# Patient Record
Sex: Male | Born: 1960 | Race: White | Hispanic: Refuse to answer | Smoking: Current every day smoker
Health system: Southern US, Community
[De-identification: ages and names within clinical notes are randomized; demographics above are authoritative.]

## PROBLEM LIST (undated history)

## (undated) DIAGNOSIS — E119 Type 2 diabetes mellitus without complications: Secondary | ICD-10-CM

## (undated) DIAGNOSIS — T7840XA Allergy, unspecified, initial encounter: Secondary | ICD-10-CM

## (undated) DIAGNOSIS — I10 Essential (primary) hypertension: Secondary | ICD-10-CM

## (undated) DIAGNOSIS — R011 Cardiac murmur, unspecified: Secondary | ICD-10-CM

## (undated) DIAGNOSIS — R6882 Decreased libido: Secondary | ICD-10-CM

## (undated) DIAGNOSIS — R0902 Hypoxemia: Secondary | ICD-10-CM

## (undated) HISTORY — DX: Hypoxemia: R09.02

## (undated) HISTORY — DX: Type 2 diabetes mellitus without complications: E11.9

## (undated) HISTORY — DX: Essential (primary) hypertension: I10

## (undated) HISTORY — DX: Decreased libido: R68.82

## (undated) HISTORY — PX: WISDOM TOOTH EXTRACTION: SHX21

## (undated) HISTORY — PX: CARDIAC VALVE REPLACEMENT: SHX585

## (undated) HISTORY — DX: Morbid (severe) obesity due to excess calories: E66.01

## (undated) HISTORY — DX: Cardiac murmur, unspecified: R01.1

## (undated) HISTORY — DX: Allergy, unspecified, initial encounter: T78.40XA

---
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
## 2004-09-02 DIAGNOSIS — E785 Hyperlipidemia, unspecified: Secondary | ICD-10-CM | POA: Insufficient documentation

## 2006-12-18 ENCOUNTER — Ambulatory Visit: Payer: Self-pay | Admitting: Gastroenterology

## 2006-12-18 LAB — HM COLONOSCOPY

## 2014-04-27 ENCOUNTER — Ambulatory Visit: Payer: Self-pay | Admitting: Family Medicine

## 2014-04-27 LAB — HEPATIC FUNCTION PANEL: ALT: 27 U/L (ref 10–40)

## 2014-04-27 LAB — LIPID PANEL
Cholesterol: 156 mg/dL (ref 0–200)
HDL: 39 mg/dL (ref 35–70)
LDL CALC: 95 mg/dL
Triglycerides: 109 mg/dL (ref 40–160)

## 2014-04-27 LAB — CBC AND DIFFERENTIAL
HCT: 44 % (ref 41–53)
Hemoglobin: 14.4 g/dL (ref 13.5–17.5)
Platelets: 189 10*3/uL (ref 150–399)
WBC: 8.9 10^3/mL

## 2014-04-27 LAB — BASIC METABOLIC PANEL
BUN: 14 mg/dL (ref 4–21)
Creatinine: 0.8 mg/dL (ref <=1.3)
GLUCOSE: 137 mg/dL
Potassium: 4.1 mmol/L (ref 3.4–5.3)
SODIUM: 141 mmol/L (ref 137–147)

## 2014-04-27 LAB — HEMOGLOBIN A1C: HEMOGLOBIN A1C: 7.4 % — AB (ref 4.0–6.0)

## 2014-05-18 LAB — PSA: PSA: 4.2

## 2015-02-08 DIAGNOSIS — E349 Endocrine disorder, unspecified: Secondary | ICD-10-CM | POA: Insufficient documentation

## 2015-02-08 DIAGNOSIS — R0902 Hypoxemia: Secondary | ICD-10-CM | POA: Insufficient documentation

## 2015-02-08 DIAGNOSIS — R972 Elevated prostate specific antigen [PSA]: Secondary | ICD-10-CM | POA: Insufficient documentation

## 2015-02-08 DIAGNOSIS — R6882 Decreased libido: Secondary | ICD-10-CM | POA: Insufficient documentation

## 2015-02-08 DIAGNOSIS — Z1389 Encounter for screening for other disorder: Secondary | ICD-10-CM | POA: Insufficient documentation

## 2015-02-09 ENCOUNTER — Ambulatory Visit (INDEPENDENT_AMBULATORY_CARE_PROVIDER_SITE_OTHER): Payer: BLUE CROSS/BLUE SHIELD | Admitting: Family Medicine

## 2015-02-09 ENCOUNTER — Encounter: Payer: Self-pay | Admitting: Family Medicine

## 2015-02-09 VITALS — BP 115/80 | HR 62 | Temp 98.5°F | Resp 16 | Wt 321.0 lb

## 2015-02-09 DIAGNOSIS — E119 Type 2 diabetes mellitus without complications: Secondary | ICD-10-CM

## 2015-02-09 DIAGNOSIS — E785 Hyperlipidemia, unspecified: Secondary | ICD-10-CM

## 2015-02-09 DIAGNOSIS — I10 Essential (primary) hypertension: Secondary | ICD-10-CM

## 2015-02-09 DIAGNOSIS — E1165 Type 2 diabetes mellitus with hyperglycemia: Secondary | ICD-10-CM

## 2015-02-09 DIAGNOSIS — IMO0002 Reserved for concepts with insufficient information to code with codable children: Secondary | ICD-10-CM

## 2015-02-09 LAB — POCT GLYCOSYLATED HEMOGLOBIN (HGB A1C)
Est. average glucose Bld gHb Est-mCnc: 154
HEMOGLOBIN A1C: 7

## 2015-02-09 MED ORDER — METFORMIN HCL ER (OSM) 500 MG PO TB24: 1000.0000 mg | ORAL_TABLET | Freq: Every day | ORAL | Status: DC

## 2015-02-09 MED ORDER — METOPROLOL SUCCINATE ER 200 MG PO TB24: 200.0000 mg | ORAL_TABLET | Freq: Every day | ORAL | Status: DC

## 2015-02-09 NOTE — Progress Notes (Signed)
Patient: Daniel Alvarado Male    DOB: 11/09/1960   54 y.o.   MRN: [id number] Visit Date: 02/09/2015  Today's Provider: Lelon Huh, MD   Chief Complaint  Patient presents with  . Follow-up    Medication  . Hypertension  . Hyperlipidemia  . Diabetes   Subjective:    HPI   Hypertension, follow-up:  BP Readings from Last 3 Encounters:  02/09/15 115/80  04/27/14 148/70    He was last seen for hypertension 10 months ago.  BP at that visit was 148/70. Management changes since that visit include none. He reports fair compliance with treatment. He is not having side effects. none  He is not exercising. He is adherent to low salt diet.   Outside blood pressures are in 130s/80s. He is experiencing none.  Patient denies none.   Cardiovascular risk factors include diabetes mellitus, dyslipidemia, hypertension and male gender.  Use of agents associated with hypertension: none.    Weight trend: stable  ------------------------------------------------------------------------   Diabetes Mellitus Type II, Follow-up:   Lab Results  Component Value Date   HGBA1C 7.0 02/09/2015   HGBA1C 7.4* 04/27/2014    Last seen for diabetes 10 months ago.  Management changes included work on wt loss and diet. He reports good compliance with treatment. He is not having side effects.  Current symptoms include none Home blood sugar records: fasting range: 120s to 150s  Episodes of hypoglycemia? yes - only if he misses meals   Current Insulin Regimen: none Most Recent Eye Exam: in August, 2015 Weight trend: stable Prior visit with dietician: yes -  Current diet: diabetic Current exercise: none  Pertinent Labs: No results found for: CHOL, TRIG, CHOLHDL, CREATININE  Wt Readings from Last 3 Encounters:  02/09/15 321 lb (145.605 kg)  04/27/14 321 lb (145.605 kg)    ------------------------------------------------------------------------   Lipid/Cholesterol, Follow-up:    Last seen for this10 months ago.  Management changes since that visit include none. . Last Lipid Panel:    Component Value Date/Time   CHOL 156 04/27/2014   TRIG 109 04/27/2014   HDL 39 04/27/2014   [place] 95 04/27/2014    He reports excellent compliance with treatment. He is not having side effects.  Current symptoms include none. Weight trend: stable Currnt diet: not asked Current exercise: none  Wt Readings from Last 3 Encounters:  02/09/15 321 lb (145.605 kg)  04/27/14 321 lb (145.605 kg)    -------------------------------------------------------------------     Previous Medications   ASPIRIN 81 MG TABLET       FELODIPINE (PLENDIL) 10 MG 24 HR TABLET    Take by mouth.   LISINOPRIL-HYDROCHLOROTHIAZIDE (PRINZIDE,ZESTORETIC) 20-25 MG PER TABLET    Take by mouth.   METFORMIN (FORTAMET) 500 MG (OSM) 24 HR TABLET    Take by mouth.   METOPROLOL (TOPROL XL) 200 MG 24 HR TABLET    Take by mouth.   MULTIPLE VITAMIN PO       OMEGA-3 FATTY ACIDS PO    Take by mouth.   PIOGLITAZONE (ACTOS) 45 MG TABLET    Take by mouth.   POTASSIUM CHLORIDE SA (KLOR-CON M20) 20 MEQ TABLET    Take by mouth.   SIMVASTATIN (ZOCOR) 40 MG TABLET    Take by mouth.    Review of Systems  History  Substance Use Topics  . Smoking status: Never Smoker   . Smokeless tobacco: Current User    Types: Snuff  . Alcohol Use: Yes   Objective:  BP 115/80 mmHg  Pulse 62  Temp(Src) 98.5 F (36.9 C) (Oral)  Resp 16  Wt 321 lb (145.605 kg)  SpO2 95%  Physical Exam  General Appearance:    Alert, cooperative, no distress  Eyes:    PERRL, conjunctiva/corneas clear, EOM's intact       Lungs:     Clear to auscultation bilaterally, respirations unlabored  Heart:    Regular rate and rhythm  Neurologic:   Awake, alert, oriented x 3. No apparent focal neurological           defect.              Assessment & Plan:     1. Type 2 diabetes mellitus without complication Fairly well controlled Continue  current medications.   - POCT HgB A1C  2. Essential hypertension well controlled Continue current medications.   - metoprolol (TOPROL XL) 200 MG 24 hr tablet; Take 1 tablet (200 mg total) by mouth daily.  Dispense: 30 tablet; Refill: 5 - Renal function panel - TSH  3. HLD (hyperlipidemia)  - ALT - Lipid panel - TSH    Follow up: No Follow-up on file.

## 2015-02-10 LAB — RENAL FUNCTION PANEL
ALBUMIN: 4 g/dL (ref 3.5–5.5)
BUN/Creatinine Ratio: 23 — ABNORMAL HIGH (ref 9–20)
BUN: 21 mg/dL (ref 6–24)
CO2: 27 mmol/L (ref 18–29)
CREATININE: 0.91 mg/dL (ref 0.76–1.27)
Calcium: 10.1 mg/dL (ref 8.7–10.2)
Chloride: 100 mmol/L (ref 97–108)
GFR calc Af Amer: 112 mL/min/{1.73_m2} (ref >59)
GFR calc non Af Amer: 97 mL/min/{1.73_m2} (ref >59)
GLUCOSE: 152 mg/dL — AB (ref 65–99)
PHOSPHORUS: 3.8 mg/dL (ref 2.5–4.5)
Potassium: 4.5 mmol/L (ref 3.5–5.2)
SODIUM: 141 mmol/L (ref 134–144)

## 2015-02-10 LAB — LIPID PANEL
CHOL/HDL RATIO: 4.7 ratio (ref 0.0–5.0)
CHOLESTEROL TOTAL: 186 mg/dL (ref 100–199)
HDL: 40 mg/dL (ref >39)
LDL CALC: 118 mg/dL — AB (ref 0–99)
TRIGLYCERIDES: 139 mg/dL (ref 0–149)
VLDL Cholesterol Cal: 28 mg/dL (ref 5–40)

## 2015-02-10 LAB — ALT: ALT: 23 IU/L (ref 0–44)

## 2015-02-10 LAB — TSH: TSH: 2.35 u[IU]/mL (ref 0.450–4.500)

## 2015-04-28 ENCOUNTER — Encounter: Payer: BLUE CROSS/BLUE SHIELD | Admitting: Family Medicine

## 2015-05-16 ENCOUNTER — Encounter: Payer: Self-pay | Admitting: Family Medicine

## 2015-05-16 ENCOUNTER — Ambulatory Visit (INDEPENDENT_AMBULATORY_CARE_PROVIDER_SITE_OTHER): Payer: Managed Care, Other (non HMO) | Admitting: Family Medicine

## 2015-05-16 VITALS — BP 120/70 | HR 55 | Temp 98.4°F | Resp 16 | Ht 71.0 in | Wt 320.0 lb

## 2015-05-16 DIAGNOSIS — E785 Hyperlipidemia, unspecified: Secondary | ICD-10-CM

## 2015-05-16 DIAGNOSIS — Z0000 Encounter for general adult medical examination without abnormal findings: Secondary | ICD-10-CM

## 2015-05-16 DIAGNOSIS — I10 Essential (primary) hypertension: Secondary | ICD-10-CM

## 2015-05-16 DIAGNOSIS — E1165 Type 2 diabetes mellitus with hyperglycemia: Secondary | ICD-10-CM

## 2015-05-16 DIAGNOSIS — IMO0002 Reserved for concepts with insufficient information to code with codable children: Secondary | ICD-10-CM

## 2015-05-16 DIAGNOSIS — E668 Other obesity: Secondary | ICD-10-CM

## 2015-05-16 MED ORDER — METOPROLOL SUCCINATE ER 100 MG PO TB24: 100.0000 mg | ORAL_TABLET | Freq: Every day | ORAL | Status: DC

## 2015-05-16 NOTE — Progress Notes (Signed)
Patient: Daniel Alvarado, Male    DOB: 1960-10-17, 54 y.o.   MRN: [id number] Visit Date: 05/16/2015  Today's Provider: Lelon Huh, MD   Chief Complaint  Patient presents with  . Annual Exam  . Hypertension    follow up  . Diabetes    follow up  . Hyperlipidemia    follow up   Subjective:    Annual physical exam Daniel Alvarado is a 54 y.o. male who presents today for health maintenance and complete physical. He feels well. He reports exercising no. He reports he is sleeping fairly well.  -----------------------------------------------------------------  Diabetes Mellitus Type II, Follow-up:   Lab Results  Component Value Date   HGBA1C 7.0 02/09/2015   HGBA1C 7.4* 04/27/2014   Last seen for diabetes 3 months ago.  Management since then includes none. He reports good compliance with treatment. He is not having side effects. none Current symptoms include none and have been stable. Home blood sugar records: fasting range: n/a  Episodes of hypoglycemia? no   Current Insulin Regimen: n/a Most Recent Eye Exam: due now Weight trend: stable  Current diet: in general, an "unhealthy" diet Current exercise: none  ------------------------------------------------------------------------   Hypertension, follow-up:  BP Readings from Last 3 Encounters:  05/16/15 120/70  02/09/15 115/80  04/27/14 148/70    He was last seen for hypertension 3 months ago.  BP at that visit was 115/80. Management since that visit includes nnoe .He reports good compliance with treatment. He is not having side effects. none  He is not exercising. He is not adherent to low salt diet.   Outside blood pressures are n/a. He is experiencing none.  Patient denies none.   Cardiovascular risk factors include diabetes mellitus.  Use of agents associated with hypertension: none.   ------------------------------------------------------------------------    Lipid/Cholesterol,  Follow-up:   Last seen for this 3 months ago.  Management since that visit includes none.  Last Lipid Panel:    Component Value Date/Time   CHOL 186 02/09/2015 0933   CHOL 156 04/27/2014   TRIG 139 02/09/2015 0933   HDL 40 02/09/2015 0933   HDL 39 04/27/2014   CHOLHDL 4.7 02/09/2015 0933   LDLCALC 118* 02/09/2015 0933   LDLCALC 95 04/27/2014    He reports good compliance with treatment. He is not having side effects. none  Wt Readings from Last 3 Encounters:  05/16/15 320 lb (145.151 kg)  02/09/15 321 lb (145.605 kg)  04/27/14 321 lb (145.605 kg)    ------------------------------------------------------------------------    Review of Systems  Constitutional: Negative.  Negative for fever, chills, appetite change and fatigue.  HENT: Negative.  Negative for congestion, ear pain, hearing loss, nosebleeds and trouble swallowing.   Eyes: Negative.  Negative for pain and visual disturbance.  Respiratory: Negative.  Negative for cough, chest tightness and shortness of breath.   Cardiovascular: Negative.  Negative for chest pain, palpitations and leg swelling.  Gastrointestinal: Negative.  Negative for nausea, vomiting, abdominal pain, diarrhea, constipation and blood in stool.  Endocrine: Negative.  Negative for polydipsia, polyphagia and polyuria.  Genitourinary: Negative.  Negative for dysuria and flank pain.  Musculoskeletal: Negative.  Negative for myalgias, back pain, joint swelling, arthralgias and neck stiffness.  Skin: Negative.  Negative for color change, rash and wound.  Allergic/Immunologic: Negative.   Neurological: Negative.  Negative for dizziness, tremors, seizures, speech difficulty, weakness, light-headedness and headaches.  Hematological: Negative.   Psychiatric/Behavioral: Negative.  Negative for behavioral problems, confusion, sleep  disturbance, dysphoric mood and decreased concentration. The patient is not nervous/anxious.     Social History He  reports  that he has never smoked. His smokeless tobacco use includes Snuff. He reports that he drinks alcohol. He reports that he does not use illicit drugs. Social History   Social History  . Marital Status: Married    Spouse Name: N/A  . Number of Children: N/A  . Years of Education: N/A   Social History Main Topics  . Smoking status: Never Smoker   . Smokeless tobacco: Current User    Types: Snuff  . Alcohol Use: Yes  . Drug Use: No  . Sexual Activity: Not Asked   Other Topics Concern  . None   Social History Narrative    Patient Active Problem List   Diagnosis Date Noted  . Hypoxia 02/08/2015  . Hypotestosteronism 02/08/2015  . Abnormal prostate specific antigen 02/08/2015  . Decreased libido 02/08/2015  . Extreme obesity 12/11/2007  . Diabetes mellitus type 2, uncontrolled 09/02/2004  . BP (high blood pressure) 09/02/2004  . HLD (hyperlipidemia) 09/02/2004    Past Surgical History  Procedure Laterality Date  . Aorta surgery  1978  . Wisdom tooth extraction      Family History  Family Status  Relation Status Death Age  . Mother Alive     broken neck for 20 yrs  . Father Deceased 65    multiple sclerosis  . Sister    . Sister    . Sister     His family history includes Diabetes in his mother.    Allergies  Allergen Reactions  . Aspirin Other (See Comments)    Hyper    Previous Medications   ASPIRIN 81 MG TABLET       FELODIPINE (PLENDIL) 10 MG 24 HR TABLET    Take by mouth.   LISINOPRIL-HYDROCHLOROTHIAZIDE (PRINZIDE,ZESTORETIC) 20-25 MG PER TABLET    Take 2 tablets by mouth daily.    METFORMIN (FORTAMET) 500 MG (OSM) 24 HR TABLET    Take 2 tablets (1,000 mg total) by mouth daily.   METOPROLOL (TOPROL XL) 200 MG 24 HR TABLET    Take 1 tablet (200 mg total) by mouth daily.   MULTIPLE VITAMIN PO       OMEGA-3 FATTY ACIDS PO    Take by mouth.   PIOGLITAZONE (ACTOS) 45 MG TABLET    Take by mouth.   POTASSIUM CHLORIDE SA (KLOR-CON M20) 20 MEQ TABLET    Take  by mouth.   SIMVASTATIN (ZOCOR) 40 MG TABLET    Take by mouth.    Patient Care Team: Birdie Sons, MD as PCP - General (Family Medicine) Royston Cowper, MD (Urology)     Objective:   Vitals: BP 120/70 mmHg  Pulse 55  Temp(Src) 98.4 F (36.9 C) (Oral)  Resp 16  Ht 5\' 11"  (1.803 m)  Wt 320 lb (145.151 kg)  BMI 44.65 kg/m2  SpO2 95%   Physical Exam   General Appearance:    Alert, cooperative, no distress, appears stated age, morbidly obese  Head:    Normocephalic, without obvious abnormality, atraumatic  Eyes:    PERRL, conjunctiva/corneas clear, EOM's intact, fundi    benign, both eyes       Ears:    Normal TM's and external ear canals, both ears  Nose:   Nares normal, septum midline, mucosa normal, no drainage   or sinus tenderness  Throat:   Lips, mucosa, and tongue normal; teeth and  gums normal  Neck:   Supple, symmetrical, trachea midline, no adenopathy;       thyroid:  No enlargement/tenderness/nodules; no carotid   bruit or JVD  Back:     Symmetric, no curvature, ROM normal, no CVA tenderness  Lungs:     Clear to auscultation bilaterally, respirations unlabored  Chest wall:    No tenderness or deformity  Heart:    Regular rate and rhythm, S1 and S2 normal, no murmur, rub   or gallop  Abdomen:     Soft, non-tender, bowel sounds active all four quadrants,    no masses, no organomegaly  Genitalia:    deferred  Rectal:    deferred  Extremities:   Extremities normal, atraumatic, no cyanosis or edema  Pulses:   2+ and symmetric all extremities  Skin:   Skin color, texture, turgor normal, no rashes or lesions  Lymph nodes:   Cervical, supraclavicular, and axillary nodes normal  Neurologic:   CNII-XII intact. Normal strength, sensation and reflexes      throughout     Depression Screen PHQ 2/9 Scores 05/16/2015  PHQ - 2 Score 0  PHQ- 9 Score 0      Assessment & Plan:     Routine Health Maintenance and Physical Exam  Exercise Activities and Dietary  recommendations Goals    None      Immunization History  Administered Date(s) Administered  . Pneumococcal Polysaccharide-23 06/01/2013  . Td 02/14/2012  . Tdap 05/27/2007    Health Maintenance  Topic Date Due  . FOOT EXAM  05/03/1972  . OPHTHALMOLOGY EXAM  05/03/1972  . HIV Screening  05/03/1977  . INFLUENZA VACCINE  04/02/2016 (Originally 04/03/2015)  . HEMOGLOBIN A1C  08/11/2015  . COLONOSCOPY  12/17/2016  . PNEUMOCOCCAL POLYSACCHARIDE VACCINE (2) 06/01/2018  . TETANUS/TDAP  02/13/2022  . Hepatitis C Screening  Completed      Discussed health benefits of physical activity, and encouraged him to engage in regular exercise appropriate for his age and condition.    --------------------------------------------------------------------  1. Annual physical exam well controlled Continue current medications.   - Comprehensive metabolic panel - EKG 56-PVXY  2. Essential hypertension  - EKG 12-Lead - metoprolol succinate (TOPROL-XL) 100 MG 24 hr tablet; Take 1 tablet (100 mg total) by mouth daily.  Dispense: 30 tablet; Refill: 5  3. Diabetes mellitus type 2, uncontrolled Doing well current medications. Counseled on improving diet.  - Hemoglobin A1c  4. HLD (hyperlipidemia) He is tolerating simvastatin well with no adverse effects.   - Lipid panel  5. Extreme obesity Diet and exercise counseling.

## 2015-05-16 NOTE — Patient Instructions (Signed)
Diabetes Mellitus and Food It is important for you to manage your blood sugar (glucose) level. Your blood glucose level can be greatly affected by what you eat. Eating healthier foods in the appropriate amounts throughout the day at about the same time each day will help you control your blood glucose level. It can also help slow or prevent worsening of your diabetes mellitus. Healthy eating may even help you improve the level of your blood pressure and reach or maintain a healthy weight.  HOW CAN FOOD AFFECT ME? Carbohydrates Carbohydrates affect your blood glucose level more than any other type of food. Your dietitian will help you determine how many carbohydrates to eat at each meal and teach you how to count carbohydrates. Counting carbohydrates is important to keep your blood glucose at a healthy level, especially if you are using insulin or taking certain medicines for diabetes mellitus. Alcohol Alcohol can cause sudden decreases in blood glucose (hypoglycemia), especially if you use insulin or take certain medicines for diabetes mellitus. Hypoglycemia can be a life-threatening condition. Symptoms of hypoglycemia (sleepiness, dizziness, and disorientation) are similar to symptoms of having too much alcohol.  If your health care provider has given you approval to drink alcohol, do so in moderation and use the following guidelines:  Women should not have more than one drink per day, and men should not have more than two drinks per day. One drink is equal to:  12 oz of beer.  5 oz of wine.  1 oz of hard liquor.  Do not drink on an empty stomach.  Keep yourself hydrated. Have water, diet soda, or unsweetened iced tea.  Regular soda, juice, and other mixers might contain a lot of carbohydrates and should be counted. WHAT FOODS ARE NOT RECOMMENDED? As you make food choices, it is important to remember that all foods are not the same. Some foods have fewer nutrients per serving than other  foods, even though they might have the same number of calories or carbohydrates. It is difficult to get your body what it needs when you eat foods with fewer nutrients. Examples of foods that you should avoid that are high in calories and carbohydrates but low in nutrients include:  Trans fats (most processed foods list trans fats on the Nutrition Facts label).  Regular soda.  Juice.  Candy.  Sweets, such as cake, pie, doughnuts, and cookies.  Fried foods. WHAT FOODS CAN I EAT? Have nutrient-rich foods, which will nourish your body and keep you healthy. The food you should eat also will depend on several factors, including:  The calories you need.  The medicines you take.  Your weight.  Your blood glucose level.  Your blood pressure level.  Your cholesterol level. You also should eat a variety of foods, including:  Protein, such as meat, poultry, fish, tofu, nuts, and seeds (lean animal proteins are best).  Fruits.  Vegetables.  Dairy products, such as milk, cheese, and yogurt (low fat is best).  Breads, grains, pasta, cereal, rice, and beans.  Fats such as olive oil, trans fat-free margarine, canola oil, avocado, and olives. DOES EVERYONE WITH DIABETES MELLITUS HAVE THE SAME MEAL PLAN? Because every person with diabetes mellitus is different, there is not one meal plan that works for everyone. It is very important that you meet with a dietitian who will help you create a meal plan that is just right for you. Document Released: 05/16/2005 Document Revised: 08/24/2013 Document Reviewed: 07/16/2013 [employer] Patient Information 2015 [employer]. This   information is not intended to replace advice given to you by your health care provider. Make sure you discuss any questions you have with your health care provider.  

## 2015-05-17 LAB — COMPREHENSIVE METABOLIC PANEL
ALT: 21 IU/L (ref 0–44)
AST: 16 IU/L (ref 0–40)
Albumin/Globulin Ratio: 1.4 (ref 1.1–2.5)
Albumin: 4 g/dL (ref 3.5–5.5)
Alkaline Phosphatase: 72 IU/L (ref 39–117)
BUN/Creatinine Ratio: 18 (ref 9–20)
BUN: 16 mg/dL (ref 6–24)
Bilirubin Total: 0.4 mg/dL (ref 0.0–1.2)
CO2: 25 mmol/L (ref 18–29)
CREATININE: 0.88 mg/dL (ref 0.76–1.27)
Calcium: 9.6 mg/dL (ref 8.7–10.2)
Chloride: 101 mmol/L (ref 97–108)
GFR calc Af Amer: 113 mL/min/{1.73_m2} (ref >59)
GFR calc non Af Amer: 98 mL/min/{1.73_m2} (ref >59)
GLUCOSE: 148 mg/dL — AB (ref 65–99)
Globulin, Total: 2.8 g/dL (ref 1.5–4.5)
Potassium: 4 mmol/L (ref 3.5–5.2)
SODIUM: 143 mmol/L (ref 134–144)
Total Protein: 6.8 g/dL (ref 6.0–8.5)

## 2015-05-17 LAB — LIPID PANEL
CHOLESTEROL TOTAL: 159 mg/dL (ref 100–199)
Chol/HDL Ratio: 4.4 ratio units (ref 0.0–5.0)
HDL: 36 mg/dL — AB (ref >39)
LDL CALC: 100 mg/dL — AB (ref 0–99)
TRIGLYCERIDES: 113 mg/dL (ref 0–149)
VLDL Cholesterol Cal: 23 mg/dL (ref 5–40)

## 2015-05-17 LAB — HEMOGLOBIN A1C
Est. average glucose Bld gHb Est-mCnc: 160 mg/dL
HEMOGLOBIN A1C: 7.2 % — AB (ref 4.8–5.6)

## 2015-05-23 LAB — HM DIABETES EYE EXAM

## 2015-06-19 ENCOUNTER — Other Ambulatory Visit: Payer: Self-pay | Admitting: Family Medicine

## 2015-07-22 ENCOUNTER — Other Ambulatory Visit: Payer: Self-pay | Admitting: Family Medicine

## 2015-08-15 ENCOUNTER — Encounter: Payer: Self-pay | Admitting: Family Medicine

## 2015-08-15 ENCOUNTER — Ambulatory Visit (INDEPENDENT_AMBULATORY_CARE_PROVIDER_SITE_OTHER): Payer: Managed Care, Other (non HMO) | Admitting: Family Medicine

## 2015-08-15 VITALS — BP 120/88 | HR 63 | Temp 98.7°F | Resp 16 | Ht 71.0 in | Wt 310.0 lb

## 2015-08-15 DIAGNOSIS — I10 Essential (primary) hypertension: Secondary | ICD-10-CM | POA: Diagnosis not present

## 2015-08-15 DIAGNOSIS — E119 Type 2 diabetes mellitus without complications: Secondary | ICD-10-CM

## 2015-08-15 LAB — POCT GLYCOSYLATED HEMOGLOBIN (HGB A1C)
Est. average glucose Bld gHb Est-mCnc: 126
HEMOGLOBIN A1C: 6

## 2015-08-15 NOTE — Progress Notes (Signed)
Patient: Daniel Alvarado Male    DOB: 07-31-1961   54 y.o.   MRN: [id number] Visit Date: 08/15/2015  Today's Provider: Lelon Huh, MD   Chief Complaint  Patient presents with  . Follow-up  . Diabetes  . Hypertension  . Hyperlipidemia   Subjective:    HPI   Diabetes Mellitus Type II, Follow-up:   Lab Results  Component Value Date   HGBA1C 7.2* 05/16/2015   HGBA1C 7.0 02/09/2015   HGBA1C 7.4* 04/27/2014   Last seen for diabetes 3 months ago.  Management since then includes; no changes. He reports fair compliance with treatment. He is having side effects. Stinging in pad on right foot Current symptoms include none and have been stable. Home blood sugar records: n/a  Episodes of hypoglycemia? no   Current Insulin Regimen: n/a Most Recent Eye Exam: 05/2015 Weight trend: decreasing steadily Prior visit with dietician: no Current diet: well balanced Current exercise: aerobics and walking  Has been on Atkins diet for the last month and is feeling well. He is planning on starting regular exercise program.  ----------------------------------------------------------------------   Hypertension, follow-up:  BP Readings from Last 3 Encounters:  08/15/15 120/88  05/16/15 120/70  02/09/15 115/80    He was last seen for hypertension 3 months ago.  BP at that visit was 120/70. Management since that visit includes; no changes.He reports good compliance with treatment. He is not having side effects. none  He is exercising. He is not adherent to low salt diet.   Outside blood pressures are 120/70. He is experiencing none.  Patient denies none.   Cardiovascular risk factors include diabetes mellitus.  Use of agents associated with hypertension: none.   ----------------------------------------------------------------------  .    Allergies  Allergen Reactions  . Aspirin Other (See Comments)    Hyper   Previous Medications   ASPIRIN 81 MG TABLET         FELODIPINE (PLENDIL) 10 MG 24 HR TABLET    Take by mouth.   LISINOPRIL-HYDROCHLOROTHIAZIDE (PRINZIDE,ZESTORETIC) 20-25 MG PER TABLET    Take 2 tablets by mouth daily.    METFORMIN (FORTAMET) 500 MG (OSM) 24 HR TABLET    Take 2 tablets (1,000 mg total) by mouth daily.   METOPROLOL SUCCINATE (TOPROL-XL) 100 MG 24 HR TABLET    Take 1 tablet (100 mg total) by mouth daily.   MULTIPLE VITAMIN PO       OMEGA-3 FATTY ACIDS PO    Take by mouth.   PIOGLITAZONE (ACTOS) 45 MG TABLET    TAKE 1 TABLET BY MOUTH DAILY   POTASSIUM CHLORIDE SA (KLOR-CON M20) 20 MEQ TABLET    Take by mouth.   SIMVASTATIN (ZOCOR) 40 MG TABLET    TAKE 1 TABLET AT BEDTIME    Review of Systems  Constitutional: Negative for fever, chills and appetite change.  Respiratory: Negative for chest tightness, shortness of breath and wheezing.   Cardiovascular: Negative for chest pain and palpitations.  Gastrointestinal: Negative for nausea, vomiting and abdominal pain.    Social History  Substance Use Topics  . Smoking status: Never Smoker   . Smokeless tobacco: Current User    Types: Snuff  . Alcohol Use: Yes   Objective:   BP 120/88 mmHg  Pulse 63  Temp(Src) 98.7 F (37.1 C) (Oral)  Resp 16  Ht 5\' 11"  (1.803 m)  Wt 310 lb (140.615 kg)  BMI 43.26 kg/m2  SpO2 95%  Physical Exam  General Appearance:  Alert, cooperative, no distress, obese  Eyes:    PERRL, conjunctiva/corneas clear, EOM's intact       Lungs:     Clear to auscultation bilaterally, respirations unlabored  Heart:    Regular rate and rhythm  Neurologic:   Awake, alert, oriented x 3. No apparent focal neurological           defect.       Results for orders placed or performed in visit on 08/15/15  POCT glycosylated hemoglobin (Hb A1C)  Result Value Ref Range   Hemoglobin A1C 6.0    Est. average glucose Bld gHb Est-mCnc 126         Assessment & Plan:     1. Controlled type 2 diabetes mellitus without complication, without long-term current use  of insulin (HCC) Continue current medications.    - POCT glycosylated hemoglobin (Hb A1C)  2. Essential hypertension Well controlled.  Continue current medications.    3. Morbid obesity, unspecified obesity type ([place]) Doing well on low carbohydrate diet. Will start increasing exercise     Return in about 4 months (around 12/14/2015).   Lelon Huh, MD  [facility]

## 2015-10-15 ENCOUNTER — Other Ambulatory Visit: Payer: Self-pay | Admitting: Family Medicine

## 2015-10-21 ENCOUNTER — Other Ambulatory Visit: Payer: Self-pay | Admitting: Family Medicine

## 2015-11-19 ENCOUNTER — Other Ambulatory Visit: Payer: Self-pay | Admitting: Family Medicine

## 2015-12-01 ENCOUNTER — Other Ambulatory Visit: Payer: Self-pay | Admitting: Family Medicine

## 2015-12-19 ENCOUNTER — Ambulatory Visit (INDEPENDENT_AMBULATORY_CARE_PROVIDER_SITE_OTHER): Payer: Managed Care, Other (non HMO) | Admitting: Family Medicine

## 2015-12-19 ENCOUNTER — Encounter: Payer: Self-pay | Admitting: Family Medicine

## 2015-12-19 VITALS — BP 140/78 | HR 56 | Temp 98.1°F | Resp 16 | Wt 310.0 lb

## 2015-12-19 DIAGNOSIS — I10 Essential (primary) hypertension: Secondary | ICD-10-CM | POA: Diagnosis not present

## 2015-12-19 DIAGNOSIS — E119 Type 2 diabetes mellitus without complications: Secondary | ICD-10-CM | POA: Diagnosis not present

## 2015-12-19 LAB — POCT GLYCOSYLATED HEMOGLOBIN (HGB A1C)
Est. average glucose Bld gHb Est-mCnc: 154
Hemoglobin A1C: 7

## 2015-12-19 NOTE — Progress Notes (Signed)
Patient: Daniel Alvarado Male    DOB: 1961-06-20   55 y.o.   MRN: [id number] Visit Date: 12/19/2015  Today's Provider: Lelon Huh, MD   Chief Complaint  Patient presents with  . Hypertension    follow up  . Diabetes    follow up   Subjective:    HPI  Hypertension, follow-up:  BP Readings from Last 3 Encounters:  08/15/15 120/88  05/16/15 120/70  02/09/15 115/80    He was last seen for hypertension 4 months ago.  BP at that visit was 120/88. Management since that visit includes no changes. He reports good compliance with treatment. He is not having side effects.  He is not exercising. He is adherent to low salt diet.   Outside blood pressures are not being checked. He is experiencing none.  Patient denies chest pain, chest pressure/discomfort, claudication, dyspnea, exertional chest pressure/discomfort, fatigue, irregular heart beat, lower extremity edema, near-syncope, orthopnea, palpitations, paroxysmal nocturnal dyspnea, syncope and tachypnea.   Cardiovascular risk factors include diabetes mellitus, hypertension, male gender and sedentary lifestyle.  Use of agents associated with hypertension: NSAIDS.     Weight trend: steady  Wt Readings from Last 3 Encounters:  08/15/15 310 lb (140.615 kg)  05/16/15 320 lb (145.151 kg)  02/09/15 321 lb (145.605 kg)    Current diet: in general, an "unhealthy" diet  ------------------------------------------------------------------------   Diabetes Mellitus Type II, Follow-up:   Lab Results  Component Value Date   HGBA1C 6.0 08/15/2015   HGBA1C 7.2* 05/16/2015   HGBA1C 7.0 02/09/2015    Last seen for diabetes 4 months ago.  Management since then includes no changes. He reports good compliance with treatment. He is not having side effects.  Current symptoms include none and have been stable. Home blood sugar records: not being checked  Episodes of hypoglycemia? no   Current Insulin Regimen: none Most  Recent Eye Exam: 1 year ago Weight trend: increasing steadily Prior visit with dietician: no Current diet: in general, an "unhealthy" diet Current exercise: none  Pertinent Labs:    Component Value Date/Time   CHOL 159 05/16/2015 1014   CHOL 156 04/27/2014   TRIG 113 05/16/2015 1014   HDL 36* 05/16/2015 1014   HDL 39 04/27/2014   LDLCALC 100* 05/16/2015 1014   LDLCALC 95 04/27/2014   CREATININE 0.88 05/16/2015 1014   CREATININE 0.8 04/27/2014    Wt Readings from Last 3 Encounters:  08/15/15 310 lb (140.615 kg)  05/16/15 320 lb (145.151 kg)  02/09/15 321 lb (145.605 kg)    ------------------------------------------------------------------------      Allergies  Allergen Reactions  . Aspirin Other (See Comments)    Hyper   Previous Medications   ASPIRIN 81 MG TABLET       FELODIPINE (PLENDIL) 10 MG 24 HR TABLET    TAKE 1 TABLETBY MOUTH DAILY  FOR BLOOD PRESSURE   LISINOPRIL-HYDROCHLOROTHIAZIDE (PRINZIDE,ZESTORETIC) 20-25 MG PER TABLET    Take 2 tablets by mouth daily.    METFORMIN (GLUCOPHAGE-XR) 500 MG 24 HR TABLET    TAKE 2 TABLETS (1,000 MG TOTAL) BY MOUTH DAILY.   METOPROLOL SUCCINATE (TOPROL-XL) 100 MG 24 HR TABLET    TAKE 1 TABLET (100 MG TOTAL) BY MOUTH DAILY.   MULTIPLE VITAMIN PO       OMEGA-3 FATTY ACIDS PO    Take by mouth.   PIOGLITAZONE (ACTOS) 45 MG TABLET    TAKE 1 TABLET BY MOUTH DAILY   POTASSIUM CHLORIDE SA (KLOR-CON  M20) 20 MEQ TABLET    Take by mouth.   SIMVASTATIN (ZOCOR) 40 MG TABLET    TAKE 1 TABLET AT BEDTIME    Review of Systems  Constitutional: Negative for fever, chills, appetite change and fatigue.  HENT: Negative for congestion, ear pain, hearing loss, nosebleeds and trouble swallowing.   Eyes: Negative for pain and visual disturbance.  Respiratory: Negative for cough, chest tightness, shortness of breath and wheezing.   Cardiovascular: Negative for chest pain, palpitations and leg swelling.  Gastrointestinal: Negative for nausea,  vomiting, abdominal pain, diarrhea, constipation and blood in stool.  Endocrine: Negative for polydipsia, polyphagia and polyuria.  Genitourinary: Negative for dysuria and flank pain.  Musculoskeletal: Negative for myalgias, back pain, joint swelling, arthralgias and neck stiffness.  Skin: Negative for color change, rash and wound.  Neurological: Positive for numbness (in left foot). Negative for dizziness, tremors, seizures, speech difficulty, weakness, light-headedness and headaches.  Psychiatric/Behavioral: Negative for behavioral problems, confusion, sleep disturbance, dysphoric mood and decreased concentration. The patient is not nervous/anxious.   All other systems reviewed and are negative.   Social History  Substance Use Topics  . Smoking status: Never Smoker   . Smokeless tobacco: Current User    Types: Snuff  . Alcohol Use: 0.0 oz/week    0 Standard drinks or equivalent per week     Comment: casual drinker   Objective:   BP 140/78 mmHg  Pulse 56  Temp(Src) 98.1 F (36.7 C) (Oral)  Resp 16  Wt 310 lb (140.615 kg)  SpO2 95%  Physical Exam  General Appearance:    Alert, cooperative, no distress, obese  Eyes:    PERRL, conjunctiva/corneas clear, EOM's intact       Lungs:     Clear to auscultation bilaterally, respirations unlabored  Heart:    Regular rate and rhythm  Neurologic:   Awake, alert, oriented x 3. No apparent focal neurological           defect.        Results for orders placed or performed in visit on 12/19/15  POCT HgB A1C  Result Value Ref Range   Hemoglobin A1C 7.0    Est. average glucose Bld gHb Est-mCnc 154        Assessment & Plan:     1. Type 2 diabetes mellitus without complication, without long-term current use of insulin (HCC) A1c up today. He has not been following diet plan consistently. He is going to work on this and start exercising regularly - POCT HgB A1C  2. Essential hypertension Counseled goal is SBP<130. He is to work on  losing weight and exercising more and is continue current medication for the time being.   Follow up and CPE in September, 2017      Lelon Huh, MD  [facility]

## 2015-12-19 NOTE — Patient Instructions (Signed)
Check fasting sugars at least 3 days a week.

## 2015-12-30 ENCOUNTER — Other Ambulatory Visit: Payer: Self-pay | Admitting: Family Medicine

## 2016-01-20 ENCOUNTER — Other Ambulatory Visit: Payer: Self-pay | Admitting: Family Medicine

## 2016-04-02 ENCOUNTER — Other Ambulatory Visit: Payer: Self-pay | Admitting: Family Medicine

## 2016-04-22 ENCOUNTER — Encounter: Payer: Managed Care, Other (non HMO) | Admitting: Family Medicine

## 2016-04-23 ENCOUNTER — Other Ambulatory Visit: Payer: Self-pay | Admitting: Family Medicine

## 2016-05-16 ENCOUNTER — Ambulatory Visit (INDEPENDENT_AMBULATORY_CARE_PROVIDER_SITE_OTHER): Payer: Managed Care, Other (non HMO) | Admitting: Family Medicine

## 2016-05-16 ENCOUNTER — Encounter: Payer: Self-pay | Admitting: Family Medicine

## 2016-05-16 VITALS — BP 158/72 | HR 60 | Temp 98.2°F | Resp 16 | Ht 71.0 in | Wt 317.0 lb

## 2016-05-16 DIAGNOSIS — E785 Hyperlipidemia, unspecified: Secondary | ICD-10-CM | POA: Diagnosis not present

## 2016-05-16 DIAGNOSIS — Z0000 Encounter for general adult medical examination without abnormal findings: Secondary | ICD-10-CM | POA: Diagnosis not present

## 2016-05-16 DIAGNOSIS — Z125 Encounter for screening for malignant neoplasm of prostate: Secondary | ICD-10-CM | POA: Diagnosis not present

## 2016-05-16 DIAGNOSIS — I10 Essential (primary) hypertension: Secondary | ICD-10-CM

## 2016-05-16 DIAGNOSIS — E119 Type 2 diabetes mellitus without complications: Secondary | ICD-10-CM | POA: Diagnosis not present

## 2016-05-16 LAB — POCT UA - MICROALBUMIN: Microalbumin Ur, POC: 20 mg/L

## 2016-05-16 NOTE — Progress Notes (Signed)
Patient: Daniel Alvarado, Male    DOB: 1961-06-25, 55 y.o.   MRN: [id number] Visit Date: 05/16/2016  Today's Provider: Lelon Huh, MD   Chief Complaint  Patient presents with  . Annual Exam  . Diabetes    follow up  . Hypertension    follow up  . Hyperlipidemia    follow up   Subjective:    Annual physical exam Daniel Alvarado is a 55 y.o. male who presents today for health maintenance and complete physical. He feels fairly well. He reports never exercising. He reports he is sleeping fairly well.  -----------------------------------------------------------------  Diabetes Mellitus Type II, Follow-up:   Lab Results  Component Value Date   HGBA1C 7.0 12/19/2015   HGBA1C 6.0 08/15/2015   HGBA1C 7.2 (H) 05/16/2015   Last seen for diabetes 5 months ago.  Management since then includes advising patient to work on diet and exercise. He reports good compliance with treatment. He is not having side effects.  Current symptoms include none and have been stable. Home blood sugar records: not being checked  Episodes of hypoglycemia? no   Current Insulin Regimen: none Most Recent Eye Exam: 1 year ago. [facility] [place]. Scheduled tomorrow. States they do diabetic eye exam. Weight trend: increasing steadily Prior visit with dietician: no Current diet: well balanced Current exercise: none  ------------------------------------------------------------------------   Hypertension, follow-up:  BP Readings from Last 3 Encounters:  12/19/15 140/78  08/15/15 120/88  05/16/15 120/70    He was last seen for hypertension 5 months ago.  BP at that visit was 140/78. Management since that visit includes advising patient to work on weight loss and exercise regularly.He reports good compliance with treatment. He is not having side effects.  He is not exercising. He is adherent to low salt diet.   Outside blood pressures are checked at the  pharmacy. He is experiencing none.  Patient denies chest pain, chest pressure/discomfort, claudication, dyspnea, exertional chest pressure/discomfort, fatigue, irregular heart beat, lower extremity edema, near-syncope, orthopnea, palpitations, paroxysmal nocturnal dyspnea, syncope and tachypnea.   Cardiovascular risk factors include diabetes mellitus, dyslipidemia and hypertension.  Use of agents associated with hypertension: NSAIDS.   ------------------------------------------------------------------------    Lipid/Cholesterol, Follow-up:   Last seen for this 1 years ago.  Management since that visit includes none.  Last Lipid Panel:    Component Value Date/Time   CHOL 159 05/16/2015 1014   TRIG 113 05/16/2015 1014   HDL 36 (L) 05/16/2015 1014   CHOLHDL 4.4 05/16/2015 1014   LDLCALC 100 (H) 05/16/2015 1014    He reports good compliance with treatment. He is not having side effects.   Wt Readings from Last 3 Encounters:  12/19/15 (!) 310 lb (140.6 kg)  08/15/15 (!) 310 lb (140.6 kg)  05/16/15 (!) 320 lb (145.2 kg)    ------------------------------------------------------------------------   Review of Systems  Constitutional: Negative for appetite change, chills, fatigue and fever.  HENT: Negative for congestion, ear pain, hearing loss, nosebleeds and trouble swallowing.   Eyes: Negative for pain and visual disturbance.  Respiratory: Negative for cough, chest tightness and shortness of breath.   Cardiovascular: Negative for chest pain, palpitations and leg swelling.  Gastrointestinal: Negative for abdominal pain, blood in stool, constipation, diarrhea, nausea and vomiting.  Endocrine: Negative for polydipsia, polyphagia and polyuria.  Genitourinary: Negative for dysuria and flank pain.  Musculoskeletal: Negative for arthralgias, back pain, joint swelling, myalgias and neck stiffness.  Skin: Negative for color change, rash  and wound.  Neurological: Negative for  dizziness, tremors, seizures, speech difficulty, weakness, light-headedness and headaches.  Psychiatric/Behavioral: Negative for behavioral problems, confusion, decreased concentration, dysphoric mood and sleep disturbance. The patient is not nervous/anxious.   All other systems reviewed and are negative.   Social History      He  reports that he has never smoked. His smokeless tobacco use includes Snuff. He reports that he drinks alcohol. He reports that he does not use drugs.       Social History   Social History  . Marital status: Married    Spouse name: N/A  . Number of children: 4  . Years of education: N/A   Occupational History  .      Lead operator for [employer]   Social History Main Topics  . Smoking status: Never Smoker  . Smokeless tobacco: Current User    Types: Snuff  . Alcohol use 0.0 oz/week     Comment: casual drinker  . Drug use: No  . Sexual activity: Not Asked   Other Topics Concern  . None   Social History Narrative  . None    Past Medical History:  Diagnosis Date  . Decreased libido   . Diabetes mellitus without complication ([place])   . Hypertension   . Hypoxia   . Morbid obesity [facility] [place])      Patient Active Problem List   Diagnosis Date Noted  . Hypoxia 02/08/2015  . Hypotestosteronism 02/08/2015  . Abnormal prostate specific antigen 02/08/2015  . Decreased libido 02/08/2015  . Extreme obesity ([place]) 12/11/2007  . Diabetes mellitus type 2, uncontrolled ([place]) 09/02/2004  . Hypertension 09/02/2004  . HLD (hyperlipidemia) 09/02/2004    Past Surgical History:  Procedure Laterality Date  . [place]  . WISDOM TOOTH EXTRACTION      Family History        Family Status  Relation Status  . Mother Alive   broken neck for 20 yrs  . Father Deceased at age 44   multiple sclerosis        His family history includes Diabetes in his mother.    Allergies  Allergen Reactions  . Aspirin Other (See Comments)    Hyper    Current  Meds  Medication Sig  . aspirin 81 MG tablet   . felodipine (PLENDIL) 10 MG 24 hr tablet TAKE 1 TABLETBY MOUTH DAILY  FOR BLOOD PRESSURE  . KLOR-CON M20 20 MEQ tablet TAKE 1 TABLET BY MOUTH EVERY MORNING  . lisinopril-hydrochlorothiazide (PRINZIDE,ZESTORETIC) 20-25 MG tablet TAKE TWO TABLETS BY MOUTH DAILY  . metFORMIN (GLUCOPHAGE-XR) 500 MG 24 hr tablet TAKE 2 TABLETS (1,000 MG TOTAL) BY MOUTH DAILY.  . metoprolol succinate (TOPROL-XL) 100 MG 24 hr tablet TAKE 1 TABLET (100 MG TOTAL) BY MOUTH DAILY.  . MULTIPLE VITAMIN PO   . OMEGA-3 FATTY ACIDS PO Take by mouth.  . pioglitazone (ACTOS) 45 MG tablet TAKE 1 TABLET BY MOUTH DAILY  . simvastatin (ZOCOR) 40 MG tablet TAKE 1 TABLET AT BEDTIME    Patient Care Team: Birdie Sons, MD as PCP - General (Family Medicine) Royston Cowper, MD (Urology)     Objective:   Vitals: BP (!) 154/82   Pulse 60   Temp 98.2 F (36.8 C) (Oral)   Resp 16   Ht 5\' 11"  (1.803 m)   Wt (!) 317 lb (143.8 kg)   BMI 44.21 kg/m    Physical Exam   General Appearance:  Alert, cooperative, no distress, appears stated age  Head:    Normocephalic, without obvious abnormality, atraumatic  Eyes:    PERRL, conjunctiva/corneas clear, EOM's intact, fundi    benign, both eyes       Ears:    Normal TM's and external ear canals, both ears  Nose:   Nares normal, septum midline, mucosa normal, no drainage   or sinus tenderness  Throat:   Lips, mucosa, and tongue normal; teeth and gums normal  Neck:   Supple, symmetrical, trachea midline, no adenopathy;       thyroid:  No enlargement/tenderness/nodules; no carotid   bruit or JVD  Back:     Symmetric, no curvature, ROM normal, no CVA tenderness  Lungs:     Clear to auscultation bilaterally, respirations unlabored  Chest wall:    No tenderness or deformity  Heart:    Regular rate and rhythm, S1 and S2 normal, no murmur, rub   or gallop. II/VI systolic murmur.   Abdomen:     Soft, non-tender, bowel sounds active  all four quadrants,    no masses, no organomegaly  Genitalia:    deferred  Rectal:    deferred  Extremities:   Extremities normal, atraumatic, no cyanosis or edema  Pulses:   2+ and symmetric all extremities  Skin:   Skin color, texture, turgor normal, no rashes or lesions  Lymph nodes:   Cervical, supraclavicular, and axillary nodes normal  Neurologic:   CNII-XII intact. Normal strength, sensation and reflexes      throughout    Depression Screen PHQ 2/9 Scores 05/16/2016 05/16/2015  PHQ - 2 Score 0 0  PHQ- 9 Score 0 0      Assessment & Plan:     Routine Health Maintenance and Physical Exam  Exercise Activities and Dietary recommendations Goals    None      Immunization History  Administered Date(s) Administered  . Pneumococcal Polysaccharide-23 06/01/2013  . Td 02/14/2012  . Tdap 05/27/2007    Health Maintenance  Topic Date Due  . FOOT EXAM  05/03/1972  . HIV Screening  05/03/1977  . INFLUENZA VACCINE  04/02/2016  . OPHTHALMOLOGY EXAM  05/22/2016  . HEMOGLOBIN A1C  06/19/2016  . COLONOSCOPY  12/17/2016  . PNEUMOCOCCAL POLYSACCHARIDE VACCINE (2) 06/01/2018  . TETANUS/TDAP  02/13/2022  . Hepatitis C Screening  Completed      Discussed health benefits of physical activity, and encouraged him to engage in regular exercise appropriate for his age and condition.    --------------------------------------------------------------------  1. Annual physical exam  - Comprehensive metabolic panel - Lipid panel - EKG 12-Lead  2. Diabetes mellitus without complication ([place]) Tolerating current meds well.  - POCT UA - Microalbumin - Hemoglobin A1c - TSH  3. Essential hypertension Uncontrolled. Consider adding Invokana if electrolytes and kidney functions are stable.  - EKG 12-Lead  4. Morbid obesity, unspecified obesity type ([place])  - TSH  5. HLD (hyperlipidemia) He is tolerating simvastatin well with no adverse effects.   - Comprehensive metabolic panel -  Lipid panel - TSH  6. Prostate cancer screening  - PSA  The entirety of the information documented in the History of Present Illness, Review of Systems and Physical Exam were personally obtained by me. Portions of this information were initially documented by Meyer Cory, CMA and reviewed by me for thoroughness and accuracy.    Lelon Huh, MD  [facility]

## 2016-05-16 NOTE — Patient Instructions (Signed)
Smokeless Tobacco Use Smokeless tobacco is a loose, fine, or stringy tobacco. The tobacco is not smoked like a cigarette, but it is chewed or held in the lips or cheeks. It resembles tea and comes from the leaves of the tobacco plant. Smokeless tobacco is usually flavored, sweetened, or processed in some way. Although smokeless tobacco is not smoked into the lungs, its chemicals are absorbed through the membranes in the mouth and into the bloodstream. Its chemicals are also swallowed in saliva. The chemicals (nicotine and other toxins) are known to cause cancer. Smokeless tobacco contains up to 28 differentcarcinogens. CAUSES Nicotine is addictive. Smokeless tobacco contains nicotine, which is a stimulant. This stimulant can give you a "buzz" or altered state. People can become addicted to the feeling it delivers.  SYMPTOMS Smokeless tobacco can cause health problems, including:  Bad breath.  Yellow-brown teeth.  Mouth sores.  Cracking and bleeding lips.  Gum disease, gum recession, and bone loss around the teeth.  Tooth decay.  Increased or irregular heart rate.  High blood pressure, heart disease, and stroke.  Cancer of the mouth, lips, tongue, pancreas, voice box (larynx), esophagus, colon, and bladder.  Precancerous lesion of the soft tissues of the mouth (leukoplakia).  Loss of your sense of taste. TREATMENT Talk with your caregiver about ways you can quit. Quitting tobacco is a good decision for your health. Nicotine is addictive, but several options are available to help you quit including:  Nicotine replacement therapy (gum or patch).  Support and cessation programs. The following tips can help you quit:  Write down the reasons you would like to quit and look at them often.  Set a date during a low stress time to stop or cut back.  Ask family and friends for their support.  Remove all tobacco products from your home and work.  Replace the chewing tobacco with  things like beef jerky, sunflower seeds, or shredded coconut.  Avoid situations that may make you want to chew tobacco.  Exercise and eat a healthy diet.  When you crave tobacco, distract yourself with drinking water, sugarless chewing gum, sugarless hard candy, exercising, or deep breathing. HOME CARE INSTRUCTIONS  See your dentist for regular oral health exams every 6 months.  Follow up with your caregiver as recommended. SEEK MEDICAL CARE OR DENTAL CARE IF:  You have bleeding or cracking lips, gums, or cheeks.  You have mouth sores, discolorations, or pain.  You have tooth pain.  You develop persistent irritation, burning, or sores in the mouth.  You have pain, tenderness, or numbness in the mouth.  You develop a lump, bumpy patch, or hardened skin inside the mouth.  The color changes inside your mouth (gray, white, or red spots).  You have difficulty chewing, swallowing, or speaking.   This information is not intended to replace advice given to you by your health care provider. Make sure you discuss any questions you have with your health care provider.   Document Released: 01/21/2011 Document Revised: 11/11/2011 Document Reviewed: 01/21/2011 Elsevier Interactive Patient Education 2016 [employer]  

## 2016-05-22 ENCOUNTER — Encounter: Payer: Self-pay | Admitting: *Deleted

## 2016-05-22 LAB — LIPID PANEL
CHOL/HDL RATIO: 4.3 ratio (ref 0.0–5.0)
Cholesterol, Total: 165 mg/dL (ref 100–199)
HDL: 38 mg/dL — AB (ref >39)
LDL Calculated: 103 mg/dL — ABNORMAL HIGH (ref 0–99)
Triglycerides: 118 mg/dL (ref 0–149)
VLDL Cholesterol Cal: 24 mg/dL (ref 5–40)

## 2016-05-22 LAB — COMPREHENSIVE METABOLIC PANEL
A/G RATIO: 1.6 (ref 1.2–2.2)
ALBUMIN: 4.2 g/dL (ref 3.5–5.5)
ALT: 19 IU/L (ref 0–44)
AST: 19 IU/L (ref 0–40)
Alkaline Phosphatase: 71 IU/L (ref 39–117)
BUN/Creatinine Ratio: 20 (ref 9–20)
BUN: 15 mg/dL (ref 6–24)
Bilirubin Total: 0.4 mg/dL (ref 0.0–1.2)
CALCIUM: 9.6 mg/dL (ref 8.7–10.2)
CO2: 28 mmol/L (ref 18–29)
Chloride: 98 mmol/L (ref 96–106)
Creatinine, Ser: 0.75 mg/dL — ABNORMAL LOW (ref 0.76–1.27)
GFR, EST AFRICAN AMERICAN: 120 mL/min/{1.73_m2} (ref >59)
GFR, EST NON AFRICAN AMERICAN: 104 mL/min/{1.73_m2} (ref >59)
GLOBULIN, TOTAL: 2.6 g/dL (ref 1.5–4.5)
Glucose: 128 mg/dL — ABNORMAL HIGH (ref 65–99)
POTASSIUM: 3.9 mmol/L (ref 3.5–5.2)
SODIUM: 141 mmol/L (ref 134–144)
TOTAL PROTEIN: 6.8 g/dL (ref 6.0–8.5)

## 2016-05-22 LAB — HEMOGLOBIN A1C
ESTIMATED AVERAGE GLUCOSE: 157 mg/dL
Hgb A1c MFr Bld: 7.1 % — ABNORMAL HIGH (ref 4.8–5.6)

## 2016-05-22 LAB — TSH: TSH: 1.81 u[IU]/mL (ref 0.450–4.500)

## 2016-05-22 LAB — PSA: PROSTATE SPECIFIC AG, SERUM: 1 ng/mL (ref 0.0–4.0)

## 2016-07-01 IMAGING — CR DG CHEST 2V
1 series · 2 of 2 positions shown · non-contrast
Comparison: None

CLINICAL DATA: Shortness of breath, hypoxia, prior heart surgery

EXAM:
CHEST  2 VIEW

[Series 1: kdxr chest pa (or ap) and lat · 0.14mm/px · 2 of 2 slices shown]
[im 1/2]
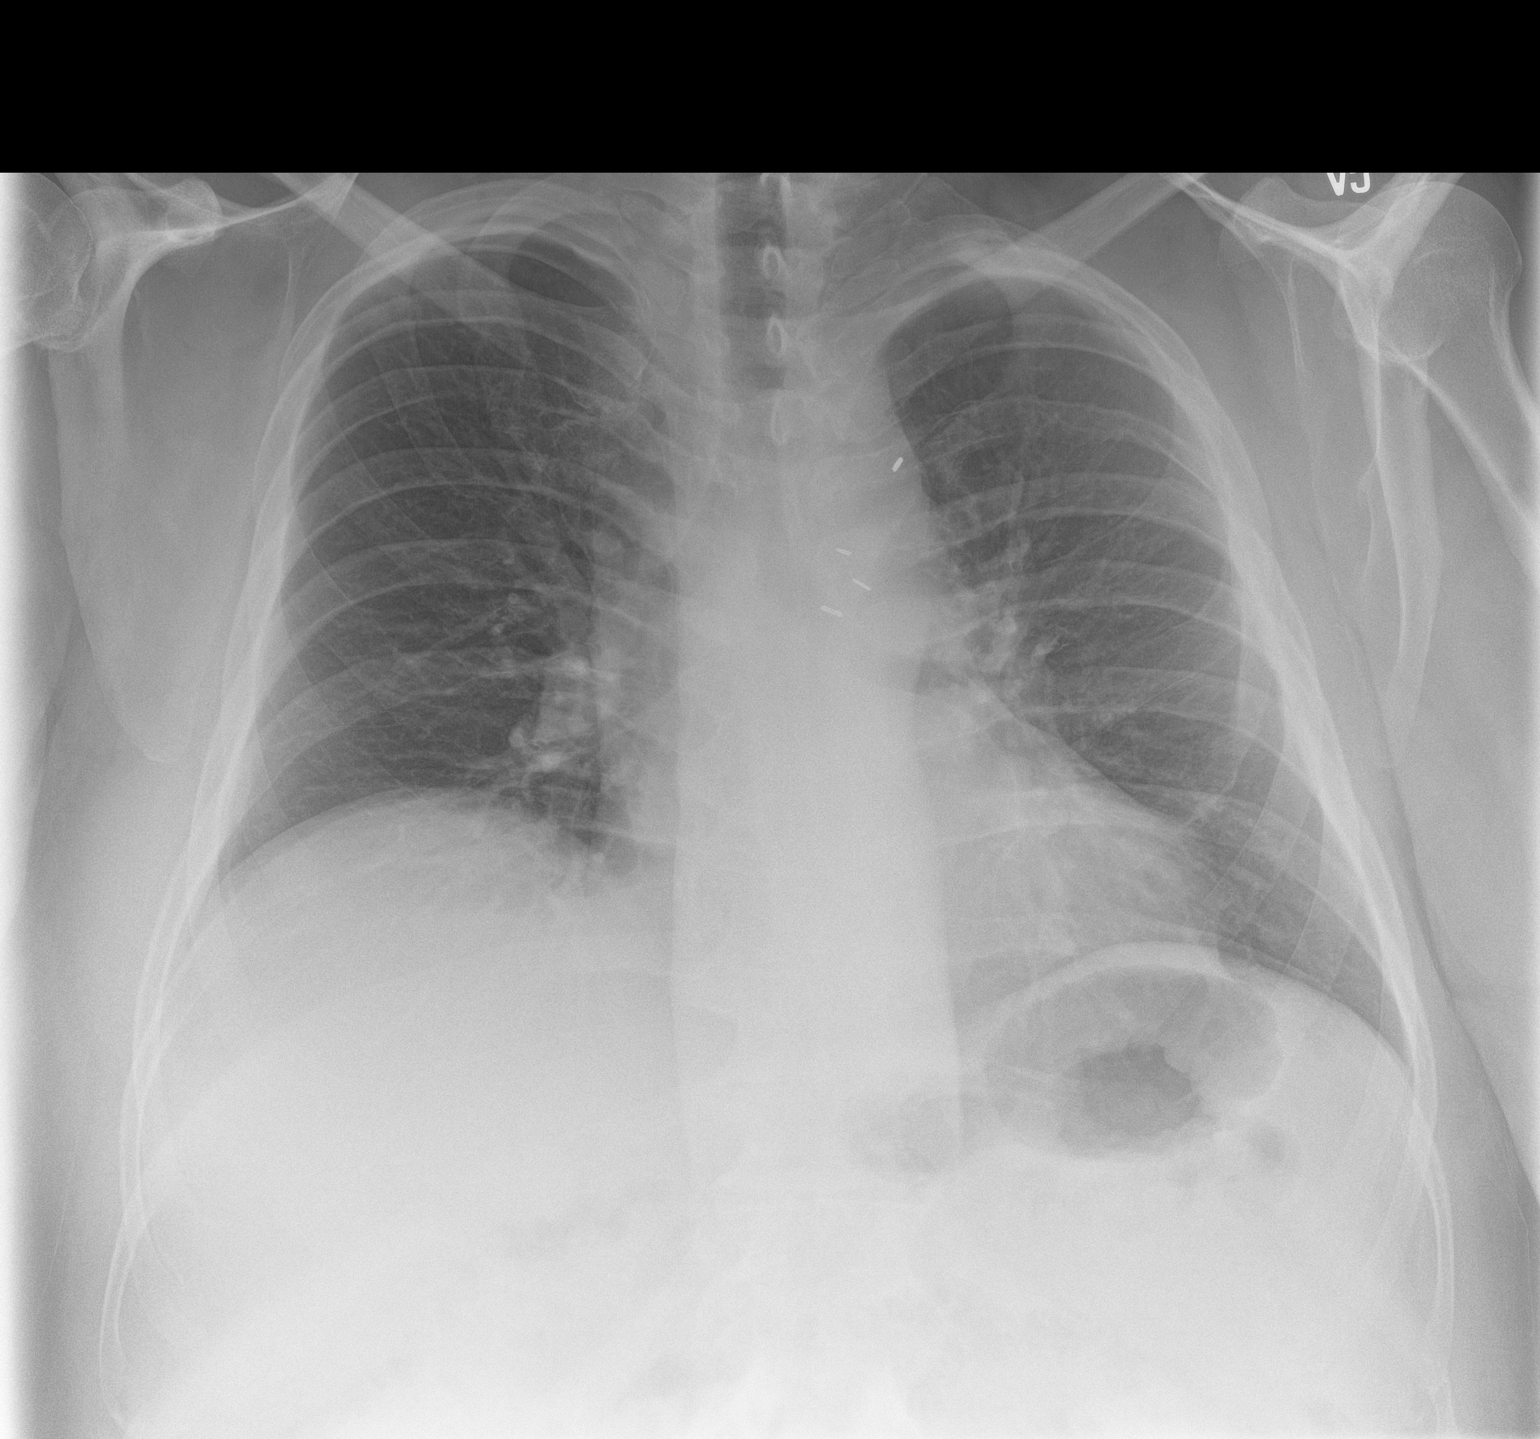
[im 2/2]
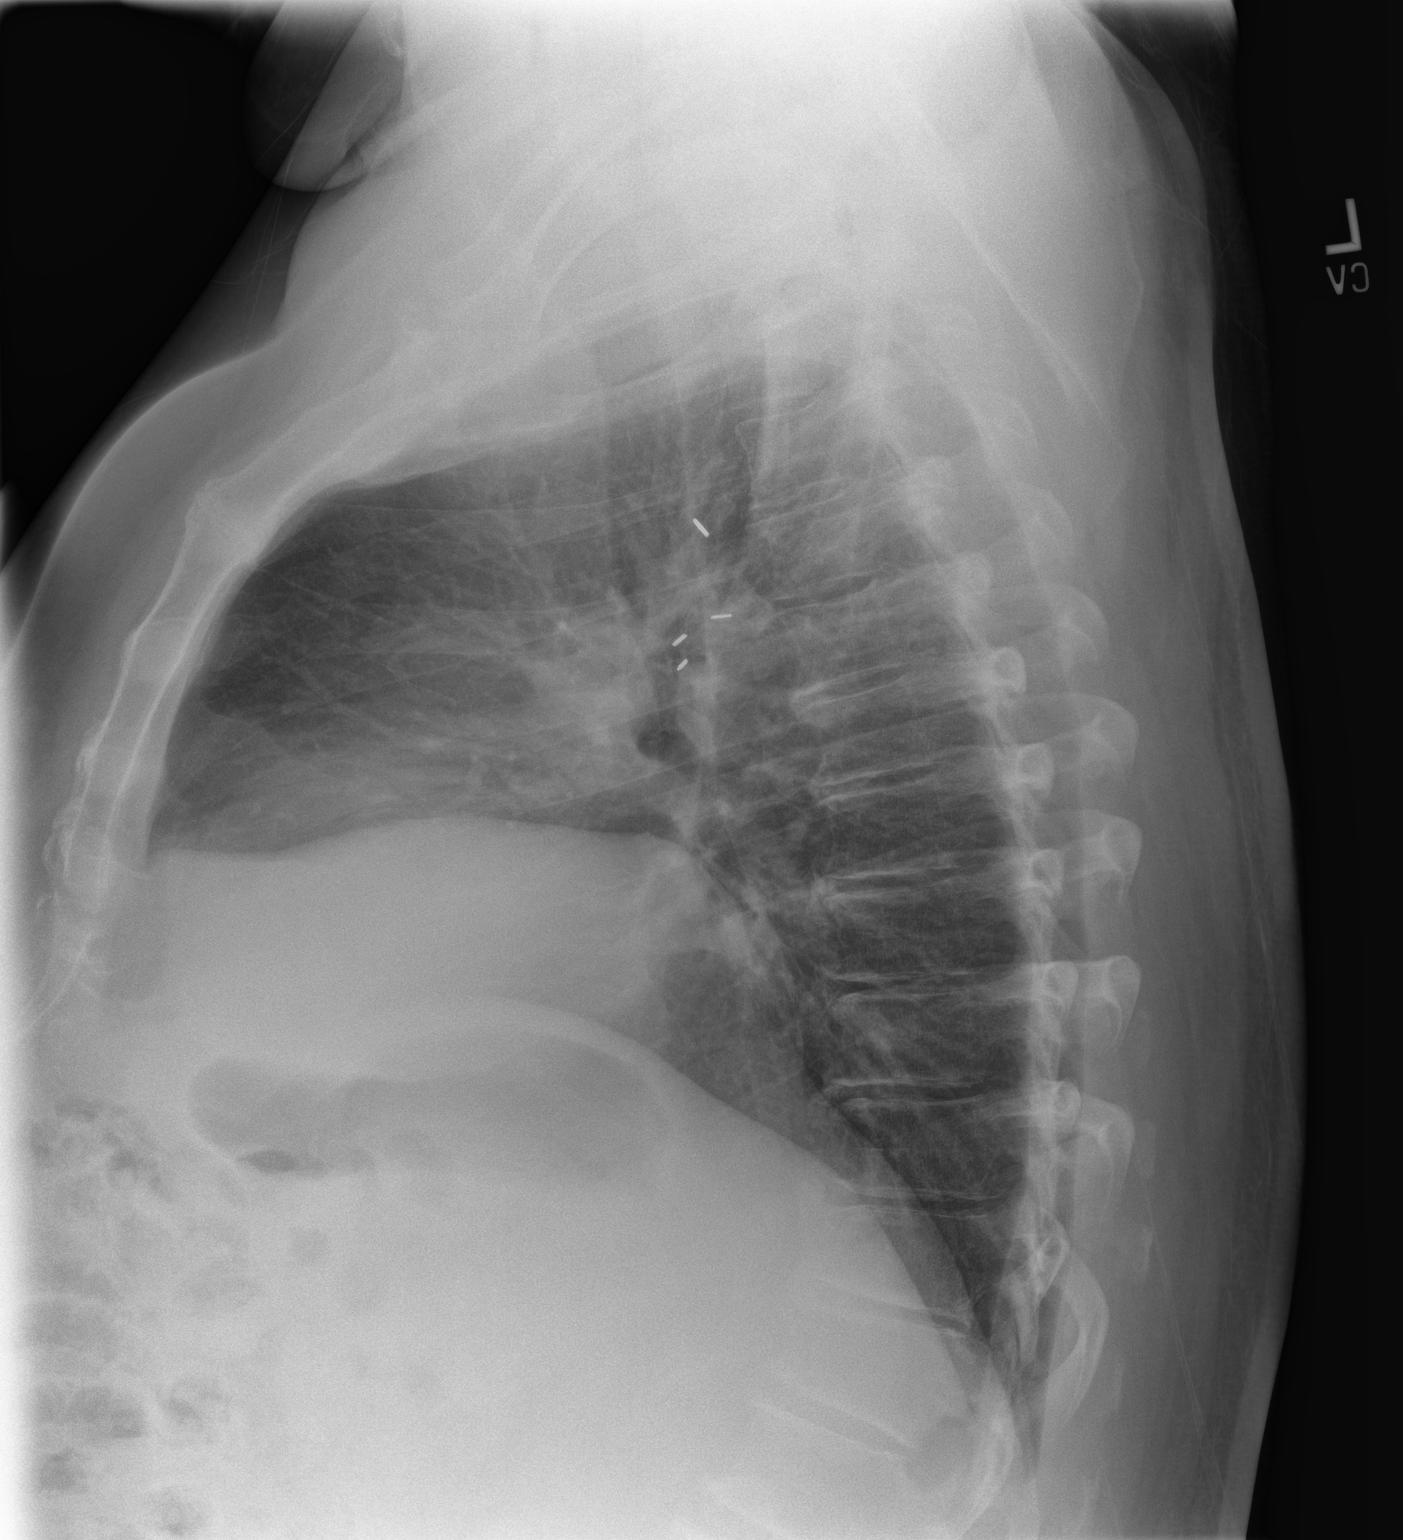

[2 of 2 positions shown; findings below may reference images not displayed]

FINDINGS: Minimal enlargement of cardiac silhouette.

Surgical clips and slight soft tissue prominence at the region of
aortic arch and AP window.

Pulmonary vascularity normal.

No acute infiltrate, pleural effusion or pneumothorax.

Mild scattered endplate spur formation thoracic spine.
IMPRESSION: Postsurgical changes of the LEFT chest, uncertain etiology, question
accounting for slight prominence soft tissue and poor definition of
the aortic arch.

Correlation with patient's surgical history and any prior outside
chest radiographs recommended to exclude mediastinal adenopathy.

No pulmonary infiltrates.

## 2016-08-11 ENCOUNTER — Other Ambulatory Visit: Payer: Self-pay | Admitting: Family Medicine

## 2016-09-02 HISTORY — PX: PROSTATE SURGERY: SHX751

## 2016-11-16 ENCOUNTER — Other Ambulatory Visit: Payer: Self-pay | Admitting: Family Medicine

## 2016-12-14 ENCOUNTER — Other Ambulatory Visit: Payer: Self-pay | Admitting: Family Medicine

## 2016-12-22 ENCOUNTER — Other Ambulatory Visit: Payer: Self-pay | Admitting: Family Medicine

## 2017-01-15 ENCOUNTER — Ambulatory Visit (INDEPENDENT_AMBULATORY_CARE_PROVIDER_SITE_OTHER): Payer: Managed Care, Other (non HMO) | Admitting: Family Medicine

## 2017-01-15 ENCOUNTER — Encounter: Payer: Self-pay | Admitting: Family Medicine

## 2017-01-15 VITALS — BP 152/80 | HR 65 | Temp 98.3°F | Resp 16 | Wt 329.0 lb

## 2017-01-15 DIAGNOSIS — E118 Type 2 diabetes mellitus with unspecified complications: Secondary | ICD-10-CM

## 2017-01-15 DIAGNOSIS — I10 Essential (primary) hypertension: Secondary | ICD-10-CM | POA: Diagnosis not present

## 2017-01-15 DIAGNOSIS — IMO0002 Reserved for concepts with insufficient information to code with codable children: Secondary | ICD-10-CM

## 2017-01-15 DIAGNOSIS — E1165 Type 2 diabetes mellitus with hyperglycemia: Secondary | ICD-10-CM | POA: Diagnosis not present

## 2017-01-15 DIAGNOSIS — E785 Hyperlipidemia, unspecified: Secondary | ICD-10-CM

## 2017-01-15 LAB — POCT GLYCOSYLATED HEMOGLOBIN (HGB A1C)
ESTIMATED AVERAGE GLUCOSE: 183
HEMOGLOBIN A1C: 8

## 2017-01-15 MED ORDER — VITAMIN B-12 1000 MCG PO TABS: 1000.0000 ug | ORAL_TABLET | Freq: Every day | ORAL | 1 refills | Status: DC

## 2017-01-15 NOTE — Progress Notes (Signed)
Patient: Daniel Alvarado Male    DOB: June 09, 1961   56 y.o.   MRN: [id number] Visit Date: 01/15/2017  Today's Provider: Lelon Huh, MD   Chief Complaint  Patient presents with  . Hypertension    follow up  . Diabetes    follow up   Subjective:    HPI  Hypertension, follow-up:  BP Readings from Last 3 Encounters:  05/16/16 (!) 158/72  12/19/15 140/78  08/15/15 120/88    He was last seen for hypertension 8 months ago.  BP at that visit was 154/82. Management since that visit includes counseling patient to limit sodium intake and exercise 30 minutes daily. He reports good compliance with treatment. He is not having side effects.  He is not exercising. He is adherent to low salt diet.   Outside blood pressures are checked at the pharmacy occasionally and average 150/80. He is experiencing none.  Patient denies chest pressure/discomfort.   Cardiovascular risk factors include diabetes mellitus, hypertension, male gender and obesity (BMI >= 30 kg/m2).  Use of agents associated with hypertension: NSAIDS.     Weight trend: fluctuating a bit Wt Readings from Last 3 Encounters:  05/16/16 (!) 317 lb (143.8 kg)  12/19/15 (!) 310 lb (140.6 kg)  08/15/15 (!) 310 lb (140.6 kg)    Current diet: well balanced  ------------------------------------------------------------------------  Diabetes Mellitus Type II, Follow-up:   Lab Results  Component Value Date   HGBA1C 7.1 (H) 05/21/2016   HGBA1C 7.0 12/19/2015   HGBA1C 6.0 08/15/2015    Last seen for diabetes 8 months ago.  Management since then includes no changes. He reports poor compliance with treatment. Patient stopped taking Metformin 3 months ago due to it causing burning sensation in his feet. He is having side effects.  Current symptoms include none and have been stable. Home blood sugar records: blood sugars are not checked at home  Episodes of hypoglycemia? no   Current Insulin Regimen: none Most  Recent Eye Exam:  Weight trend: fluctuating a bit Prior visit with dietician: no Current diet: well balanced Current exercise: none  Pertinent Labs:    Component Value Date/Time   CHOL 165 05/21/2016 0852   TRIG 118 05/21/2016 0852   HDL 38 (L) 05/21/2016 0852   LDLCALC 103 (H) 05/21/2016 0852   CREATININE 0.75 (L) 05/21/2016 0852    Wt Readings from Last 3 Encounters:  05/16/16 (!) 317 lb (143.8 kg)  12/19/15 (!) 310 lb (140.6 kg)  08/15/15 (!) 310 lb (140.6 kg)    ------------------------------------------------------------------------     Allergies  Allergen Reactions  . Aspirin Other (See Comments)    Hyper     Current Outpatient Prescriptions:  .  aspirin 81 MG tablet, , Disp: , Rfl:  .  felodipine (PLENDIL) 10 MG 24 hr tablet, TAKE 1 TABLETBY MOUTH DAILY  FOR BLOOD PRESSURE, Disp: 30 tablet, Rfl: 0 .  KLOR-CON M20 20 MEQ tablet, TAKE 1 TABLET BY MOUTH EVERY MORNING, Disp: 30 tablet, Rfl: 12 .  lisinopril-hydrochlorothiazide (PRINZIDE,ZESTORETIC) 20-25 MG tablet, Take 2 tablets by mouth daily., Disp: 60 tablet, Rfl: 0 .  metFORMIN (GLUCOPHAGE-XR) 500 MG 24 hr tablet, TAKE 2 TABLETS (1,000 MG TOTAL) BY MOUTH DAILY., Disp: 60 tablet, Rfl: 12 .  metoprolol succinate (TOPROL-XL) 100 MG 24 hr tablet, TAKE 1 TABLET (100 MG TOTAL) BY MOUTH DAILY., Disp: 30 tablet, Rfl: 11 .  MULTIPLE VITAMIN PO, , Disp: , Rfl:  .  OMEGA-3 FATTY ACIDS PO, Take  by mouth., Disp: , Rfl:  .  pioglitazone (ACTOS) 45 MG tablet, TAKE ONE TABLET BY MOUTH DAILY, Disp: 30 tablet, Rfl: 11 .  simvastatin (ZOCOR) 40 MG tablet, TAKE 1 TABLET AT BEDTIME, Disp: 30 tablet, Rfl: 11  Review of Systems  Constitutional: Negative for appetite change, chills and fever.  Respiratory: Negative for chest tightness, shortness of breath and wheezing.   Cardiovascular: Negative for chest pain and palpitations.  Gastrointestinal: Negative for abdominal pain, nausea and vomiting.  Endocrine: Negative for cold  intolerance, heat intolerance, polydipsia, polyphagia and polyuria.    Social History  Substance Use Topics  . Smoking status: Never Smoker  . Smokeless tobacco: Current User    Types: Snuff  . Alcohol use 0.0 oz/week     Comment: casual drinker   Objective:   BP (!) 152/80 (BP Location: Left Arm, Patient Position: Sitting, Cuff Size: Large)   Pulse 65   Temp 98.3 F (36.8 C) (Oral)   Resp 16   Wt (!) 329 lb (149.2 kg)   SpO2 98% Comment: room air  BMI 45.89 kg/m  There were no vitals filed for this visit.   Physical Exam  General Appearance:    Alert, cooperative, no distress, obese  Eyes:    PERRL, conjunctiva/corneas clear, EOM's intact       Lungs:     Clear to auscultation bilaterally, respirations unlabored  Heart:    Regular rate and rhythm, II/vi systolic murmur.   Neurologic:   Awake, alert, oriented x 3. No apparent focal neurological           defect.       Results for orders placed or performed in visit on 01/15/17  POCT HgB A1C  Result Value Ref Range   Hemoglobin A1C 8.0    Est. average glucose Bld gHb Est-mCnc 183        Assessment & Plan:     1. Uncontrolled type 2 diabetes mellitus with complication, without long-term current use of insulin (HCC) Worsening. Has not been following diet well, particularly as he is going through a divorce. He is going to get more strict with diet. Start back on metformin and take with OTC vitamin B12 - POCT HgB A1C  2. Essential hypertension Worsening, due to weight gain. Consider increasing betablcoker if not better at follow up.   3. Morbid obesity, unspecified obesity type ([place])   4. Hyperlipidemia, unspecified hyperlipidemia type He is tolerating simvastatin well with no adverse effects.         Lelon Huh, MD  [facility]

## 2017-01-21 ENCOUNTER — Other Ambulatory Visit: Payer: Self-pay | Admitting: Family Medicine

## 2017-02-13 ENCOUNTER — Other Ambulatory Visit: Payer: Self-pay | Admitting: Family Medicine

## 2017-05-15 ENCOUNTER — Telehealth: Payer: Self-pay | Admitting: Family Medicine

## 2017-05-15 DIAGNOSIS — R972 Elevated prostate specific antigen [PSA]: Secondary | ICD-10-CM

## 2017-05-15 DIAGNOSIS — I10 Essential (primary) hypertension: Secondary | ICD-10-CM

## 2017-05-15 DIAGNOSIS — IMO0002 Reserved for concepts with insufficient information to code with codable children: Secondary | ICD-10-CM

## 2017-05-15 DIAGNOSIS — Z0000 Encounter for general adult medical examination without abnormal findings: Secondary | ICD-10-CM

## 2017-05-15 DIAGNOSIS — E1165 Type 2 diabetes mellitus with hyperglycemia: Secondary | ICD-10-CM

## 2017-05-15 DIAGNOSIS — E785 Hyperlipidemia, unspecified: Secondary | ICD-10-CM

## 2017-05-15 DIAGNOSIS — E118 Type 2 diabetes mellitus with unspecified complications: Secondary | ICD-10-CM

## 2017-05-15 DIAGNOSIS — R6882 Decreased libido: Secondary | ICD-10-CM

## 2017-05-15 NOTE — Telephone Encounter (Signed)
Needs order for hgba1c, fasting lipids, met C, and PSA. Thanks.

## 2017-05-15 NOTE — Telephone Encounter (Signed)
Please advise 

## 2017-05-15 NOTE — Telephone Encounter (Signed)
Pt is requesting to pick up a lab slip tomorrow morning.  Pt is having his CPE on Monday.  CB#[phone]/MW

## 2017-05-15 NOTE — Telephone Encounter (Signed)
Labs ordered. Patient was advised office will be closed tomorrow. Pt is just going to wait to have labs done Monday.

## 2017-05-19 ENCOUNTER — Ambulatory Visit (INDEPENDENT_AMBULATORY_CARE_PROVIDER_SITE_OTHER): Payer: PRIVATE HEALTH INSURANCE | Admitting: Family Medicine

## 2017-05-19 ENCOUNTER — Encounter: Payer: Self-pay | Admitting: Family Medicine

## 2017-05-19 VITALS — BP 138/78 | HR 60 | Temp 98.5°F | Resp 16 | Ht 70.5 in | Wt 316.0 lb

## 2017-05-19 DIAGNOSIS — E668 Other obesity: Secondary | ICD-10-CM | POA: Diagnosis not present

## 2017-05-19 DIAGNOSIS — E1165 Type 2 diabetes mellitus with hyperglycemia: Secondary | ICD-10-CM

## 2017-05-19 DIAGNOSIS — E118 Type 2 diabetes mellitus with unspecified complications: Secondary | ICD-10-CM | POA: Diagnosis not present

## 2017-05-19 DIAGNOSIS — Z1211 Encounter for screening for malignant neoplasm of colon: Secondary | ICD-10-CM

## 2017-05-19 DIAGNOSIS — Z125 Encounter for screening for malignant neoplasm of prostate: Secondary | ICD-10-CM | POA: Diagnosis not present

## 2017-05-19 DIAGNOSIS — Z0000 Encounter for general adult medical examination without abnormal findings: Secondary | ICD-10-CM

## 2017-05-19 DIAGNOSIS — IMO0002 Reserved for concepts with insufficient information to code with codable children: Secondary | ICD-10-CM

## 2017-05-19 DIAGNOSIS — E785 Hyperlipidemia, unspecified: Secondary | ICD-10-CM

## 2017-05-19 DIAGNOSIS — I10 Essential (primary) hypertension: Secondary | ICD-10-CM

## 2017-05-19 LAB — COMPLETE METABOLIC PANEL WITH GFR
AG Ratio: 1.6 (calc) (ref 1.0–2.5)
ALBUMIN MSPROF: 4.1 g/dL (ref 3.6–5.1)
ALKALINE PHOSPHATASE (APISO): 70 U/L (ref 40–115)
ALT: 17 U/L (ref 9–46)
AST: 15 U/L (ref 10–35)
BILIRUBIN TOTAL: 0.5 mg/dL (ref 0.2–1.2)
BUN: 14 mg/dL (ref 7–25)
CHLORIDE: 104 mmol/L (ref 98–110)
CO2: 34 mmol/L — ABNORMAL HIGH (ref 20–32)
Calcium: 9.8 mg/dL (ref 8.6–10.3)
Creat: 0.77 mg/dL (ref 0.70–1.33)
GFR, Est African American: 118 mL/min/{1.73_m2} (ref >=60)
GFR, Est Non African American: 102 mL/min/{1.73_m2} (ref >=60)
GLOBULIN: 2.6 g/dL (ref 1.9–3.7)
Glucose, Bld: 131 mg/dL — ABNORMAL HIGH (ref 65–99)
Potassium: 4.5 mmol/L (ref 3.5–5.3)
SODIUM: 144 mmol/L (ref 135–146)
Total Protein: 6.7 g/dL (ref 6.1–8.1)

## 2017-05-19 LAB — HEMOGLOBIN A1C
HEMOGLOBIN A1C: 6.7 %{Hb} — AB (ref <5.7)
Mean Plasma Glucose: 146 (calc)
eAG (mmol/L): 8.1 (calc)

## 2017-05-19 LAB — LIPID PANEL
Cholesterol: 135 mg/dL (ref <200)
HDL: 39 mg/dL — ABNORMAL LOW (ref >40)
LDL CHOLESTEROL (CALC): 76 mg/dL
NON-HDL CHOLESTEROL (CALC): 96 mg/dL (ref <130)
TRIGLYCERIDES: 117 mg/dL (ref <150)
Total CHOL/HDL Ratio: 3.5 (calc) (ref <5.0)

## 2017-05-19 LAB — PSA: PSA: 0.9 ng/mL (ref <=4.0)

## 2017-05-19 NOTE — Progress Notes (Signed)
Patient: Daniel Alvarado, Male    DOB: 11/16/1960, 56 y.o.   MRN: [id number] Visit Date: 05/19/2017  Today's Provider: Lelon Huh, MD   Chief Complaint  Patient presents with  . Annual Exam  . Hypertension    follow up  . Diabetes    follow up  . Hyperlipidemia    follow up   Subjective:    Annual physical exam Daniel Alvarado is a 56 y.o. male who presents today for health maintenance and complete physical. He feels fairly well. He reports never exercising. He reports he is sleeping fairly well.  -----------------------------------------------------------------  Hypertension, follow-up:  BP Readings from Last 3 Encounters:  01/15/17 (!) 152/80  05/16/16 (!) 158/72  12/19/15 140/78    He was last seen for hypertension 4 months ago.  BP at that visit was 152/80. Management since that visit includes no changes. He reports good compliance with treatment. He is not having side effects.  He is not exercising. He is adherent to low salt diet.   Outside blood pressures are not being checked. He is experiencing lower extremity edema.  Patient denies chest pain, chest pressure/discomfort, claudication, dyspnea, exertional chest pressure/discomfort, fatigue, irregular heart beat, near-syncope, orthopnea, palpitations, paroxysmal nocturnal dyspnea, syncope and tachypnea.   Cardiovascular risk factors include advanced age (older than 18 for men, 58 for women), diabetes mellitus, dyslipidemia, hypertension and male gender.  Use of agents associated with hypertension: NSAIDS.     Weight trend: decreasing steadily Wt Readings from Last 3 Encounters:  01/15/17 (!) 329 lb (149.2 kg)  05/16/16 (!) 317 lb (143.8 kg)  12/19/15 (!) 310 lb (140.6 kg)    Current diet: in general, an "unhealthy" diet  ------------------------------------------------------------------------  Diabetes Mellitus Type II, Follow-up:   Lab Results  Component Value Date   HGBA1C 8.0  01/15/2017   HGBA1C 7.1 (H) 05/21/2016   HGBA1C 7.0 12/19/2015    Last seen for diabetes 4 months ago.  Management since then includes counseling patient to get strict with diet and start back on Metformin. Patient was also advised to take OTC Vitamin B12. He reports good compliance with treatment. He is not having side effects.  Current symptoms include none and have been stable. Home blood sugar records: blood sugars not being checked  Episodes of hypoglycemia? no   Current Insulin Regimen: none Most Recent Eye Exam: <1 year ago at [facility] Weight trend: decreasing steadily Prior visit with dietician: no Current diet: in general, an "unhealthy" diet Current exercise: none  Pertinent Labs:    Component Value Date/Time   CHOL 165 05/21/2016 0852   TRIG 118 05/21/2016 0852   HDL 38 (L) 05/21/2016 0852   LDLCALC 103 (H) 05/21/2016 0852   CREATININE 0.75 (L) 05/21/2016 0852    Wt Readings from Last 3 Encounters:  01/15/17 (!) 329 lb (149.2 kg)  05/16/16 (!) 317 lb (143.8 kg)  12/19/15 (!) 310 lb (140.6 kg)    ------------------------------------------------------------------------  Lipid/Cholesterol, Follow-up:   Last seen for this 4 months ago.  Management changes since that visit include none. . Last Lipid Panel:    Component Value Date/Time   CHOL 165 05/21/2016 0852   TRIG 118 05/21/2016 0852   HDL 38 (L) 05/21/2016 0852   CHOLHDL 4.3 05/21/2016 0852   LDLCALC 103 (H) 05/21/2016 7253    Risk factors for vascular disease include diabetes mellitus, hypercholesterolemia and hypertension  He reports good compliance with treatment. He is not having side effects.  Current symptoms include none and have been stable. Weight trend: decreasing steadily Prior visit with dietician: no Current diet: in general, an "unhealthy" diet Current exercise: none  Wt Readings from Last 3 Encounters:  01/15/17 (!) 329 lb (149.2 kg)  05/16/16 (!) 317 lb (143.8 kg)    12/19/15 (!) 310 lb (140.6 kg)    -------------------------------------------------------------------   Review of Systems  Constitutional: Negative for appetite change, chills, fatigue and fever.  HENT: Negative for congestion, ear pain, hearing loss, nosebleeds and trouble swallowing.   Eyes: Negative for pain and visual disturbance.  Respiratory: Negative for cough, chest tightness and shortness of breath.   Cardiovascular: Positive for leg swelling. Negative for chest pain and palpitations.  Gastrointestinal: Negative for abdominal pain, blood in stool, constipation, diarrhea, nausea and vomiting.  Endocrine: Negative for polydipsia, polyphagia and polyuria.  Genitourinary: Negative for dysuria and flank pain.  Musculoskeletal: Negative for arthralgias, back pain, joint swelling, myalgias and neck stiffness.  Skin: Negative for color change, rash and wound.  Neurological: Negative for dizziness, tremors, seizures, speech difficulty, weakness, light-headedness and headaches.  Psychiatric/Behavioral: Negative for behavioral problems, confusion, decreased concentration, dysphoric mood and sleep disturbance. The patient is not nervous/anxious.   All other systems reviewed and are negative.   Social History      He  reports that he has never smoked. His smokeless tobacco use includes Snuff. He reports that he drinks alcohol. He reports that he does not use drugs.       Social History   Social History  . Marital status: Married    Spouse name: N/A  . Number of children: 4  . Years of education: N/A   Occupational History  .      Lead operator for [employer]   Social History Main Topics  . Smoking status: Never Smoker  . Smokeless tobacco: Current User    Types: Snuff  . Alcohol use 0.0 oz/week     Comment: casual drinker  . Drug use: No  . Sexual activity: Not Asked   Other Topics Concern  . None   Social History Narrative  . None    Past Medical History:   Diagnosis Date  . Decreased libido   . Diabetes mellitus without complication ([place])   . Hypertension   . Hypoxia   . Morbid obesity [facility])      Patient Active Problem List   Diagnosis Date Noted  . Hypoxia 02/08/2015  . Hypotestosteronism 02/08/2015  . Abnormal prostate specific antigen 02/08/2015  . Decreased libido 02/08/2015  . Extreme obesity 12/11/2007  . Diabetes mellitus type 2, uncontrolled ([place]) 09/02/2004  . Hypertension 09/02/2004  . HLD (hyperlipidemia) 09/02/2004    Past Surgical History:  Procedure Laterality Date  . [place]  . WISDOM TOOTH EXTRACTION      Family History        Family Status  Relation Status  . Mother Alive       broken neck for 20 yrs  . Father Deceased at age 42       multiple sclerosis        His family history includes Diabetes in his mother.     Allergies  Allergen Reactions  . Aspirin Other (See Comments)    Hyper     Current Outpatient Prescriptions:  .  aspirin 81 MG tablet, , Disp: , Rfl:  .  Cyanocobalamin 1500 MCG TBDP, Take 1 tablet by mouth daily., Disp: , Rfl:  .  felodipine (  PLENDIL) 10 MG 24 hr tablet, TAKE 1 TABLETBY MOUTH DAILY  FOR BLOOD PRESSURE, Disp: 30 tablet, Rfl: 5 .  lisinopril-hydrochlorothiazide (PRINZIDE,ZESTORETIC) 20-25 MG tablet, Take 2 tablets by mouth daily., Disp: 60 tablet, Rfl: 5 .  metFORMIN (GLUCOPHAGE-XR) 500 MG 24 hr tablet, TAKE 2 TABLETS (1,000 MG TOTAL) BY MOUTH DAILY., Disp: 60 tablet, Rfl: 12 .  metoprolol succinate (TOPROL-XL) 100 MG 24 hr tablet, TAKE 1 TABLET (100 MG TOTAL) BY MOUTH DAILY., Disp: 30 tablet, Rfl: 11 .  MULTIPLE VITAMIN PO, , Disp: , Rfl:  .  OMEGA-3 FATTY ACIDS PO, Take 1,200 mcg by mouth. , Disp: , Rfl:  .  pioglitazone (ACTOS) 45 MG tablet, TAKE ONE TABLET BY MOUTH DAILY, Disp: 30 tablet, Rfl: 11 .  potassium chloride SA (K-DUR,KLOR-CON) 20 MEQ tablet, TAKE 1 TABLET BY MOUTH EVERY MORNING, Disp: 30 tablet, Rfl: 11 .  simvastatin (ZOCOR) 40 MG tablet,  TAKE 1 TABLET AT BEDTIME, Disp: 30 tablet, Rfl: 11   Patient Care Team: Birdie Sons, MD as PCP - General (Family Medicine) Royston Cowper, MD (Urology)      Objective:   Vitals: BP 138/78 (BP Location: Left Arm, Patient Position: Sitting, Cuff Size: Large)   Pulse 60   Temp 98.5 F (36.9 C) (Oral)   Resp 16   Ht 5' 10.5" (1.791 m)   Wt (!) 316 lb (143.3 kg)   SpO2 98% Comment: room air  BMI 44.70 kg/m   There were no vitals filed for this visit.   Physical Exam   General Appearance:    Alert, cooperative, no distress, appears stated age, morbidly obese  Head:    Normocephalic, without obvious abnormality, atraumatic  Eyes:    PERRL, conjunctiva/corneas clear, EOM's intact, fundi    benign, both eyes       Ears:    Normal TM's and external ear canals, both ears  Nose:   Nares normal, septum midline, mucosa normal, no drainage   or sinus tenderness  Throat:   Lips, mucosa, and tongue normal; teeth and gums normal  Neck:   Supple, symmetrical, trachea midline, no adenopathy;       thyroid:  No enlargement/tenderness/nodules; no carotid   bruit or JVD  Back:     Symmetric, no curvature, ROM normal, no CVA tenderness  Lungs:     Clear to auscultation bilaterally, respirations unlabored  Chest wall:    No tenderness or deformity  Heart:    Regular rate and rhythm, S1 and S2 normal, no murmur, rub   or gallop  Abdomen:     Soft, non-tender, bowel sounds active all four quadrants,    no masses, no organomegaly  Genitalia:    deferred  Rectal:    deferred  Extremities:   Extremities normal, atraumatic, no cyanosis or edema  Pulses:   2+ and symmetric all extremities  Skin:   Skin color, texture, turgor normal, no rashes or lesions  Lymph nodes:   Cervical, supraclavicular, and axillary nodes normal  Neurologic:   CNII-XII intact. Normal strength, sensation and reflexes      throughout   EKG: Sinus bradycardia VR=55 bpm  Depression Screen PHQ 2/9 Scores 05/19/2017  05/16/2016 05/16/2015  PHQ - 2 Score 0 0 0  PHQ- 9 Score 1 0 0      Assessment & Plan:     Routine Health Maintenance and Physical Exam  Exercise Activities and Dietary recommendations Goals    None      Immunization  History  Administered Date(s) Administered  . Pneumococcal Polysaccharide-23 06/01/2013  . Td 02/14/2012  . Tdap 05/27/2007    Health Maintenance  Topic Date Due  . FOOT EXAM  05/03/1972  . HIV Screening  05/03/1977  . OPHTHALMOLOGY EXAM  05/22/2016  . COLONOSCOPY  12/17/2016  . INFLUENZA VACCINE  04/02/2017  . HEMOGLOBIN A1C  07/18/2017  . PNEUMOCOCCAL POLYSACCHARIDE VACCINE (2) 06/01/2018  . TETANUS/TDAP  02/13/2022  . Hepatitis C Screening  Completed     Discussed health benefits of physical activity, and encouraged him to engage in regular exercise appropriate for his age and condition.    --------------------------------------------------------------------  1. Annual physical exam Recommended flu vaccine which he refused.  - EKG 12-Lead  2. Essential hypertension Stable. Continue current medications.   - EKG 12-Lead  3. Hyperlipidemia, unspecified hyperlipidemia type He is tolerating simvastatin well with no adverse effects.  Lipid panel pending  4. Uncontrolled type 2 diabetes mellitus with complication, without long-term current use of insulin (HCC) A1c pending.  - EKG 12-Lead  5. Prostate cancer screening PSA pending.   6. Extreme obesity Diet and exercise counseling.   7. Colon cancer screening  - Ambulatory referral to Gastroenterology     The entirety of the information documented in the History of Present Illness, Review of Systems and Physical Exam were personally obtained by me. Portions of this information were initially documented by Meyer Cory, CMA and reviewed by me for thoroughness and accuracy.     Lelon Huh, MD  [facility]

## 2017-05-21 ENCOUNTER — Encounter: Payer: Self-pay | Admitting: Family Medicine

## 2017-05-27 ENCOUNTER — Other Ambulatory Visit: Payer: Self-pay | Admitting: Family Medicine

## 2017-06-15 ENCOUNTER — Other Ambulatory Visit: Payer: Self-pay | Admitting: Family Medicine

## 2017-07-29 ENCOUNTER — Telehealth: Payer: Self-pay | Admitting: Family Medicine

## 2017-07-30 NOTE — Telephone Encounter (Signed)
Pt is returning call.  CB#[phone]/MW

## 2017-07-31 ENCOUNTER — Telehealth: Payer: Self-pay | Admitting: Gastroenterology

## 2017-07-31 NOTE — Telephone Encounter (Signed)
LVM for pt to return my call.

## 2017-07-31 NOTE — Telephone Encounter (Signed)
Patient had a call to schedule colonoscopy and is ready. Please call

## 2017-07-31 NOTE — Telephone Encounter (Addendum)
Returned call. Advised pt that [facility] has been trying to contact him to setup an appt. Patient stated that he will call GI to schedule appt.

## 2017-08-06 NOTE — Telephone Encounter (Signed)
LVM for pt to contact office to schedule colonoscopy.  Thanks Marshella Tello 

## 2017-08-15 ENCOUNTER — Encounter: Payer: Self-pay | Admitting: *Deleted

## 2017-09-14 ENCOUNTER — Other Ambulatory Visit: Payer: Self-pay | Admitting: Family Medicine

## 2017-09-29 ENCOUNTER — Ambulatory Visit: Payer: PRIVATE HEALTH INSURANCE | Admitting: Family Medicine

## 2017-09-29 ENCOUNTER — Encounter: Payer: Self-pay | Admitting: Family Medicine

## 2017-09-29 VITALS — BP 130/70 | HR 66 | Temp 98.0°F | Resp 16 | Ht 71.0 in | Wt 319.0 lb

## 2017-09-29 DIAGNOSIS — E11649 Type 2 diabetes mellitus with hypoglycemia without coma: Secondary | ICD-10-CM

## 2017-09-29 DIAGNOSIS — I10 Essential (primary) hypertension: Secondary | ICD-10-CM | POA: Diagnosis not present

## 2017-09-29 DIAGNOSIS — E785 Hyperlipidemia, unspecified: Secondary | ICD-10-CM | POA: Diagnosis not present

## 2017-09-29 LAB — POCT GLYCOSYLATED HEMOGLOBIN (HGB A1C)
Est. average glucose Bld gHb Est-mCnc: 169
Hemoglobin A1C: 7.5

## 2017-09-29 MED ORDER — DAPAGLIFLOZIN PROPANEDIOL 5 MG PO TABS: 5.0000 mg | ORAL_TABLET | Freq: Every day | ORAL | 3 refills | Status: DC

## 2017-09-29 NOTE — Patient Instructions (Addendum)
   You can stop pioglitazone (Actos) as soon as you start new medication [facility])

## 2017-09-29 NOTE — Progress Notes (Signed)
Patient: Daniel Alvarado Male    DOB: 26-Feb-1961   57 y.o.   MRN: [id number] Visit Date: 09/29/2017  Today's Provider: Lelon Huh, MD   Chief Complaint  Patient presents with  . Follow-up  . Diabetes  . Hypertension   Subjective:    HPI   Diabetes Mellitus Type II, Follow-up:   Lab Results  Component Value Date   HGBA1C 7.5 09/29/2017   HGBA1C 6.7 (H) 05/19/2017   HGBA1C 8.0 01/15/2017   Last seen for diabetes 4 months ago.  Management since then includes; no changes. He reports good compliance with treatment. He is not having side effects. none Current symptoms include none and have been unchanged. Home blood sugar records: fasting range: not checking  Episodes of hypoglycemia? no   Current Insulin Regimen: n/a Most Recent Eye Exam: 8/18-Dr. Kerin Ransom Weight trend: stable Prior visit with dietician: no Current diet: well balanced Current exercise: none  ------------------------------------------------------------------------   Hypertension, follow-up:  BP Readings from Last 3 Encounters:  09/29/17 130/70  05/19/17 138/78  01/15/17 (!) 152/80    He was last seen for hypertension 4 months ago.  BP at that visit was 136/78. Management since that visit includes; no changes.He reports good compliance with treatment. He is not having side effects. none He is not exercising. He is adherent to low salt diet.   Outside blood pressures are 136/80. He is experiencing none.  Patient denies none.   Cardiovascular risk factors include diabetes mellitus.  Use of agents associated with hypertension: none.   Wt Readings from Last 3 Encounters:  09/29/17 (!) 319 lb (144.7 kg)  05/19/17 (!) 316 lb (143.3 kg)  01/15/17 (!) 329 lb (149.2 kg)     ----------------------------------------------------------------  Follow up obesity States he has not been following diet well the last few months an not getting exercise except for walking with his work.    Allergies  Allergen Reactions  . Aspirin Other (See Comments)    Hyper     Current Outpatient Medications:  .  aspirin 81 MG tablet, , Disp: , Rfl:  .  Cyanocobalamin 1500 MCG TBDP, Take 1 tablet by mouth daily., Disp: , Rfl:  .  felodipine (PLENDIL) 10 MG 24 hr tablet, TAKE 1 TABLETBY MOUTH DAILY  FOR BLOOD PRESSURE, Disp: 90 tablet, Rfl: 4 .  lisinopril-hydrochlorothiazide (PRINZIDE,ZESTORETIC) 20-25 MG tablet, TAKE 2 TABLETS BY MOUTH DAILY., Disp: 180 tablet, Rfl: 4 .  metFORMIN (GLUCOPHAGE-XR) 500 MG 24 hr tablet, TAKE 2 TABLETS (1,000 MG TOTAL) BY MOUTH DAILY., Disp: 180 tablet, Rfl: 2 .  metoprolol succinate (TOPROL-XL) 100 MG 24 hr tablet, TAKE 1 TABLET (100 MG TOTAL) BY MOUTH DAILY., Disp: 30 tablet, Rfl: 11 .  MULTIPLE VITAMIN PO, , Disp: , Rfl:  .  OMEGA-3 FATTY ACIDS PO, Take 1,200 mcg by mouth. , Disp: , Rfl:  .  pioglitazone (ACTOS) 45 MG tablet, TAKE ONE TABLET BY MOUTH DAILY, Disp: 30 tablet, Rfl: 11 .  potassium chloride SA (K-DUR,KLOR-CON) 20 MEQ tablet, TAKE 1 TABLET BY MOUTH EVERY MORNING, Disp: 30 tablet, Rfl: 11 .  simvastatin (ZOCOR) 40 MG tablet, TAKE 1 TABLET AT BEDTIME, Disp: 30 tablet, Rfl: 11  Review of Systems  Constitutional: Negative for appetite change, chills and fever.  Respiratory: Negative for chest tightness, shortness of breath and wheezing.   Cardiovascular: Negative for chest pain and palpitations.  Gastrointestinal: Negative for abdominal pain, nausea and vomiting.    Social History   Tobacco Use  .  Smoking status: Never Smoker  . Smokeless tobacco: Current User    Types: Snuff  . Tobacco comment: uses dip  Substance Use Topics  . Alcohol use: Yes    Alcohol/week: 0.0 oz    Comment: casual drinker   Objective:   BP 130/70 (BP Location: Right Arm, Patient Position: Sitting, Cuff Size: Large)   Pulse 66   Temp 98 F (36.7 C) (Oral)   Resp 16   Ht 5\' 11"  (1.803 m)   Wt (!) 319 lb (144.7 kg)   SpO2 94%   BMI 44.49 kg/m  Vitals:    09/29/17 0905  BP: 130/70  Pulse: 66  Resp: 16  Temp: 98 F (36.7 C)  TempSrc: Oral  SpO2: 94%  Weight: (!) 319 lb (144.7 kg)  Height: 5\' 11"  (1.803 m)     Physical Exam  General Appearance:    Alert, cooperative, no distress, obese  Eyes:    PERRL, conjunctiva/corneas clear, EOM's intact       Lungs:     Clear to auscultation bilaterally, respirations unlabored  Heart:    Regular rate and rhythm  Neurologic:   Awake, alert, oriented x 3. No apparent focal neurological           defect.         Results for orders placed or performed in visit on 09/29/17  POCT glycosylated hemoglobin (Hb A1C)  Result Value Ref Range   Hemoglobin A1C 7.5    Est. average glucose Bld gHb Est-mCnc 169        Assessment & Plan:     1. Uncontrolled type 2 diabetes mellitus with hypoglycemia without coma (HCC) A1c is up today. Compliant with medications. Discussed options of adding SGLT-2 or GLP1 agonist. He would rather try pill and would like medication that would help in lose weight.  - POCT glycosylated hemoglobin (Hb A1C)  He refused flu vaccine today.   2. Morbid obesity, unspecified obesity type ([place]) Counseled regarding prudent diet and regular exercise.    3. Essential hypertension Well controlled.  Continue current medications.    4. Hyperlipidemia, unspecified hyperlipidemia type He is tolerating simvastatin well with no adverse effects.    Return in about 3 months (around 12/28/2017).        Lelon Huh, MD  [facility]

## 2017-10-14 ENCOUNTER — Other Ambulatory Visit: Payer: Self-pay | Admitting: Family Medicine

## 2017-10-17 ENCOUNTER — Telehealth: Payer: Self-pay | Admitting: Family Medicine

## 2017-10-17 NOTE — Telephone Encounter (Signed)
Patient was advised. patient

## 2017-10-17 NOTE — Telephone Encounter (Signed)
Neither one of these is a substitute for [place]. He needs to check his insurance and see if they prefer [employer] or [employer] which are substitutes for [place].

## 2017-10-17 NOTE — Telephone Encounter (Signed)
Please advise 

## 2017-10-17 NOTE — Telephone Encounter (Signed)
Pt stated that when he went to pick up dapagliflozin propanediol (FARXIGA) 5 MG TABS tablet he was advised the medication isn't covered by his insurance and was going to cost over 500$ out of pocket. Pt stated he spoke with his insurance and they suggested pt try either Januvia or Tradjenta. Pt is requesting a call back to discuss a new medication being sent to [facility]. Please advise. Thanks TNP

## 2017-10-22 ENCOUNTER — Other Ambulatory Visit: Payer: Self-pay | Admitting: Family Medicine

## 2017-10-22 NOTE — Telephone Encounter (Signed)
Pharmacy requesting refills. Thanks!  

## 2017-10-23 NOTE — Telephone Encounter (Signed)
Patient called back concerning message below. Patient stated per his insurance, they will cover both Invokana and Jardiance. Patient sated that he will try which ever mediation Dr. Caryn Section sends to pharmacy. Also patient wanted to know when he starts the new medication, does he need to continue taking pioglitazone in the am with other diabetes medications? Please advise?

## 2017-10-24 MED ORDER — EMPAGLIFLOZIN 10 MG PO TABS: 10.0000 mg | ORAL_TABLET | Freq: Every day | ORAL | 3 refills | Status: DC

## 2017-10-24 NOTE — Telephone Encounter (Signed)
Have sent prescription for Jardiance to his pharmacy. He can stop the pioglitazone for now. Don't throw it away in case we need to restart it. We'll see how his a1c looks in may.

## 2017-10-24 NOTE — Telephone Encounter (Signed)
Patient was advised. Expressed understanding.  

## 2017-12-31 ENCOUNTER — Other Ambulatory Visit: Payer: Self-pay | Admitting: Family Medicine

## 2017-12-31 ENCOUNTER — Telehealth: Payer: Self-pay | Admitting: Family Medicine

## 2017-12-31 NOTE — Telephone Encounter (Signed)
Please advise 

## 2017-12-31 NOTE — Telephone Encounter (Signed)
All he needs is A1c which we will check in office.

## 2017-12-31 NOTE — Telephone Encounter (Signed)
Pt states he has an appt on Monday and would like to get his labs done prior to the appt.

## 2017-12-31 NOTE — Telephone Encounter (Signed)
Patient was advised.  

## 2018-01-05 ENCOUNTER — Ambulatory Visit: Payer: PRIVATE HEALTH INSURANCE | Admitting: Family Medicine

## 2018-01-05 ENCOUNTER — Encounter: Payer: Self-pay | Admitting: Family Medicine

## 2018-01-05 VITALS — BP 140/62 | HR 69 | Temp 98.7°F | Resp 16 | Wt 306.0 lb

## 2018-01-05 DIAGNOSIS — IMO0002 Reserved for concepts with insufficient information to code with codable children: Secondary | ICD-10-CM

## 2018-01-05 DIAGNOSIS — E1165 Type 2 diabetes mellitus with hyperglycemia: Secondary | ICD-10-CM | POA: Diagnosis not present

## 2018-01-05 DIAGNOSIS — I10 Essential (primary) hypertension: Secondary | ICD-10-CM | POA: Diagnosis not present

## 2018-01-05 DIAGNOSIS — E118 Type 2 diabetes mellitus with unspecified complications: Secondary | ICD-10-CM

## 2018-01-05 LAB — POCT GLYCOSYLATED HEMOGLOBIN (HGB A1C)
Est. average glucose Bld gHb Est-mCnc: 200
Hemoglobin A1C: 8.6

## 2018-01-05 NOTE — Progress Notes (Signed)
Patient: Daniel Alvarado Male    DOB: 03-Aug-1961   57 y.o.   MRN: [id number] Visit Date: 01/05/2018  Today's Provider: Lelon Huh, MD   Chief Complaint  Patient presents with  . Diabetes    follow up  . Hypertension    follow up   Subjective:    HPI  Diabetes Mellitus Type II, Follow-up:   Lab Results  Component Value Date   HGBA1C 7.5 09/29/2017   HGBA1C 6.7 (H) 05/19/2017   HGBA1C 8.0 01/15/2017    Last seen for diabetes 3 months ago.  Management during the last visit includes adding [place]. Changes made since then includes replacing [place] with Jardiance due to cost. He reports good compliance with treatment. Patient states he was out of Metformin for the past week, but restarted yesterday. Patient states at the last visit he was told to put Actos on hold.  He is not having side effects.  Current symptoms include none and have been stable. Home blood sugar records: blood sugars are not being checked  Episodes of hypoglycemia? no   Current Insulin Regimen: none Most Recent Eye Exam: 04/2017 Weight trend: decreasing steadily Prior visit with dietician: no Current diet: well balanced Current exercise: none  Pertinent Labs:    Component Value Date/Time   CHOL 135 05/19/2017 0918   CHOL 165 05/21/2016 0852   TRIG 117 05/19/2017 0918   HDL 39 (L) 05/19/2017 0918   HDL 38 (L) 05/21/2016 0852   LDLCALC 76 05/19/2017 0918   CREATININE 0.77 05/19/2017 0918    Wt Readings from Last 3 Encounters:  09/29/17 (!) 319 lb (144.7 kg)  05/19/17 (!) 316 lb (143.3 kg)  01/15/17 (!) 329 lb (149.2 kg)    ------------------------------------------------------------------------  Hypertension, follow-up:  BP Readings from Last 3 Encounters:  09/29/17 130/70  05/19/17 138/78  01/15/17 (!) 152/80    He was last seen for hypertension 3 months ago.  BP at that visit was 130/70. Management since that visit includes no changes. He reports good compliance with  treatment. He is not having side effects.  He is not exercising. He is adherent to low salt diet.   Outside blood pressures are not being checked. He is experiencing none.  Patient denies chest pain, chest pressure/discomfort, claudication, dyspnea, exertional chest pressure/discomfort, fatigue, irregular heart beat, lower extremity edema, near-syncope, orthopnea, palpitations, paroxysmal nocturnal dyspnea, syncope and tachypnea.   Cardiovascular risk factors include diabetes mellitus, hypertension, male gender, obesity (BMI >= 30 kg/m2) and sedentary lifestyle.  Use of agents associated with hypertension: NSAIDS.     Weight trend: decreasing steadily Wt Readings from Last 3 Encounters:  09/29/17 (!) 319 lb (144.7 kg)  05/19/17 (!) 316 lb (143.3 kg)  01/15/17 (!) 329 lb (149.2 kg)    Current diet: well balanced  ------------------------------------------------------------------------     Allergies  Allergen Reactions  . Aspirin Other (See Comments)    Hyper     Current Outpatient Medications:  .  aspirin 81 MG tablet, , Disp: , Rfl:  .  empagliflozin (JARDIANCE) 10 MG TABS tablet, Take 10 mg by mouth daily., Disp: 30 tablet, Rfl: 3 .  felodipine (PLENDIL) 10 MG 24 hr tablet, TAKE 1 TABLETBY MOUTH DAILY  FOR BLOOD PRESSURE, Disp: 60 tablet, Rfl: 3 .  lisinopril-hydrochlorothiazide (PRINZIDE,ZESTORETIC) 20-25 MG tablet, TAKE 2 TABLETS BY MOUTH DAILY., Disp: 120 tablet, Rfl: 3 .  metFORMIN (GLUCOPHAGE-XR) 500 MG 24 hr tablet, TAKE 2 TABLETS (1,000 MG TOTAL) BY MOUTH  DAILY., Disp: 180 tablet, Rfl: 4 .  metoprolol succinate (TOPROL-XL) 100 MG 24 hr tablet, TAKE 1 TABLET (100 MG TOTAL) BY MOUTH DAILY., Disp: 90 tablet, Rfl: 4 .  MULTIPLE VITAMIN PO, , Disp: , Rfl:  .  OMEGA-3 FATTY ACIDS PO, Take 1,200 mcg by mouth. , Disp: , Rfl:  .  potassium chloride SA (K-DUR,KLOR-CON) 20 MEQ tablet, TAKE 1 TABLET BY MOUTH EVERY MORNING, Disp: 30 tablet, Rfl: 11 .  simvastatin (ZOCOR) 40 MG  tablet, TAKE ONE TABLET BY MOUTH EVERY NIGHT AT BEDTIME, Disp: 90 tablet, Rfl: 4 .  Cyanocobalamin 1500 MCG TBDP, Take 1 tablet by mouth daily., Disp: , Rfl:  .  pioglitazone (ACTOS) 45 MG tablet, TAKE ONE TABLET BY MOUTH DAILY (Patient not taking: Reported on 01/05/2018), Disp: 90 tablet, Rfl: 4  Review of Systems  Constitutional: Negative for appetite change, chills and fever.  Respiratory: Negative for chest tightness, shortness of breath and wheezing.   Cardiovascular: Negative for chest pain and palpitations.  Gastrointestinal: Negative for abdominal pain, nausea and vomiting.    Social History   Tobacco Use  . Smoking status: Never Smoker  . Smokeless tobacco: Current User    Types: Snuff  . Tobacco comment: uses dip  Substance Use Topics  . Alcohol use: Yes    Alcohol/week: 0.0 oz    Comment: casual drinker   Objective:   BP 140/62 (BP Location: Left Arm, Patient Position: Sitting, Cuff Size: Large)   Pulse 69   Temp 98.7 F (37.1 C) (Oral)   Resp 16   Wt (!) 306 lb (138.8 kg)   SpO2 97% Comment: room air  BMI 42.68 kg/m     Physical Exam  General appearance: alert, well developed, well nourished, cooperative and in no distress, obese Head: Normocephalic, without obvious abnormality, atraumatic Respiratory: Respirations even and unlabored, normal respiratory rate   Results for orders placed or performed in visit on 01/05/18  POCT HgB A1C  Result Value Ref Range   Hemoglobin A1C 8.6    Est. average glucose Bld gHb Est-mCnc 200         Assessment & Plan:     1. Uncontrolled type 2 diabetes mellitus with complication, without long-term current use of insulin (HCC) A1c up since change from pioglitazone to Jardiance. If electrolytes stable will increase to 25mg  daily.  - POCT HgB A1C - Renal function panel  2. Essential hypertension Stable, Continue current medications.    Return in about 4 months (around 05/08/2018) for Yearly Physical.        Lelon Huh, MD  [facility]

## 2018-01-06 ENCOUNTER — Telehealth: Payer: Self-pay

## 2018-01-06 LAB — RENAL FUNCTION PANEL
Albumin: 4 g/dL (ref 3.5–5.5)
BUN / CREAT RATIO: 16 (ref 9–20)
BUN: 15 mg/dL (ref 6–24)
CO2: 26 mmol/L (ref 20–29)
Calcium: 9.7 mg/dL (ref 8.7–10.2)
Chloride: 100 mmol/L (ref 96–106)
Creatinine, Ser: 0.94 mg/dL (ref 0.76–1.27)
GFR calc non Af Amer: 91 mL/min/{1.73_m2} (ref >59)
GFR, EST AFRICAN AMERICAN: 105 mL/min/{1.73_m2} (ref >59)
GLUCOSE: 242 mg/dL — AB (ref 65–99)
POTASSIUM: 3.5 mmol/L (ref 3.5–5.2)
Phosphorus: 3.2 mg/dL (ref 2.5–4.5)
SODIUM: 142 mmol/L (ref 134–144)

## 2018-01-06 MED ORDER — EMPAGLIFLOZIN 25 MG PO TABS: 25.0000 mg | ORAL_TABLET | Freq: Every day | ORAL | 3 refills | Status: DC

## 2018-01-06 NOTE — Telephone Encounter (Signed)
-----   Message from Birdie Sons, MD sent at 01/06/2018  8:06 AM EDT ----- Kidney functions and electrolytes are good. Increase Jardiance to 25mg  once a day, #30, rf x 3. Schedule follow up 3-4 months.

## 2018-01-06 NOTE — Telephone Encounter (Signed)
Advised patient of results. Medication was sent into the pharmacy.  

## 2018-02-13 ENCOUNTER — Other Ambulatory Visit: Payer: Self-pay | Admitting: Family Medicine

## 2018-05-03 ENCOUNTER — Other Ambulatory Visit: Payer: Self-pay | Admitting: Family Medicine

## 2018-05-17 ENCOUNTER — Other Ambulatory Visit: Payer: Self-pay | Admitting: Family Medicine

## 2018-05-25 ENCOUNTER — Encounter: Payer: Self-pay | Admitting: Family Medicine

## 2018-05-25 ENCOUNTER — Ambulatory Visit (INDEPENDENT_AMBULATORY_CARE_PROVIDER_SITE_OTHER): Payer: PRIVATE HEALTH INSURANCE | Admitting: Family Medicine

## 2018-05-25 VITALS — BP 149/74 | HR 83 | Temp 98.0°F | Resp 16 | Ht 71.0 in | Wt 282.0 lb

## 2018-05-25 DIAGNOSIS — E785 Hyperlipidemia, unspecified: Secondary | ICD-10-CM | POA: Diagnosis not present

## 2018-05-25 DIAGNOSIS — Z23 Encounter for immunization: Secondary | ICD-10-CM

## 2018-05-25 DIAGNOSIS — Z125 Encounter for screening for malignant neoplasm of prostate: Secondary | ICD-10-CM

## 2018-05-25 DIAGNOSIS — Z0000 Encounter for general adult medical examination without abnormal findings: Secondary | ICD-10-CM

## 2018-05-25 DIAGNOSIS — E11649 Type 2 diabetes mellitus with hypoglycemia without coma: Secondary | ICD-10-CM | POA: Diagnosis not present

## 2018-05-25 DIAGNOSIS — I10 Essential (primary) hypertension: Secondary | ICD-10-CM | POA: Diagnosis not present

## 2018-05-25 NOTE — Progress Notes (Signed)
Patient: Daniel Alvarado, Male    DOB: 1960-11-04, 57 y.o.   MRN: [id number] Visit Date: 05/25/2018  Today's Provider: Lelon Huh, MD   Chief Complaint  Patient presents with  . Annual Exam  . Diabetes  . Hypertension  . Hyperlipidemia   Subjective:    Annual physical exam Daniel Alvarado is a 57 y.o. male who presents today for health maintenance and complete physical. He feels well. He reports exercising some. He reports he is sleeping fairly well.  -----------------------------------------------------------------   Diabetes Mellitus Type II, Follow-up:   Lab Results  Component Value Date   HGBA1C 8.6 01/05/2018   HGBA1C 7.5 09/29/2017   HGBA1C 6.7 (H) 05/19/2017   Last seen for diabetes 4 months ago.  Management since then includes; increased Jardiance to 25 mg qd. He reports good compliance with treatment. He is not having side effects. none Current symptoms include none and have been unchanged. Home blood sugar records: fasting range: not checking  Episodes of hypoglycemia? no   Current Insulin Regimen: n/a Most Recent Eye Exam: 04/2018 Weight trend: steadily going down.  Prior visit with dietician: no Current diet: well balanced Current exercise: walking  ---------------------------------------------------------------   Hypertension, follow-up:  BP Readings from Last 3 Encounters:  05/25/18 (!) 149/74  01/05/18 140/62  09/29/17 130/70    He was last seen for hypertension 4 months ago.  BP at that visit was 140/62. Management since that visit includes; no changes.He reports good compliance with treatment. He is not having side effects. none He is exercising. He is adherent to low salt diet.   Outside blood pressures are not checking. He is experiencing none.  Patient denies none.   Cardiovascular risk factors include diabetes mellitus.  Use of agents associated with hypertension: none.    ---------------------------------------------------------------   Lipid/Cholesterol, Follow-up:   Last seen for this 8 months ago.  Management since that visit includes; no changes.  Last Lipid Panel:    Component Value Date/Time   CHOL 135 05/19/2017 0918   CHOL 165 05/21/2016 0852   TRIG 117 05/19/2017 0918   HDL 39 (L) 05/19/2017 0918   HDL 38 (L) 05/21/2016 0852   CHOLHDL 3.5 05/19/2017 0918   LDLCALC 76 05/19/2017 0918    He reports good compliance with treatment. He is not having side effects. none  Wt Readings from Last 3 Encounters:  05/25/18 282 lb (127.9 kg)  01/05/18 (!) 306 lb (138.8 kg)  09/29/17 (!) 319 lb (144.7 kg)    --------------------------------------------------------------  Review of Systems  All other systems reviewed and are negative.   Social History      He  reports that he has never smoked. His smokeless tobacco use includes snuff. He reports that he drinks alcohol. He reports that he does not use drugs.       Social History   Socioeconomic History  . Marital status: Married    Spouse name: Not on file  . Number of children: 4  . Years of education: Not on file  . 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 Financial resource strain: Not on file  . Food insecurity:    Worry: Not on file    Inability: Not on file  . Transportation needs:    Medical: Not on file    Non-medical: Not on file  Tobacco Use  .  Smoking status: Never Smoker  . Smokeless tobacco: Current User    Types: Snuff  . Tobacco comment: uses dip  Substance and Sexual Activity  . Alcohol use: Yes    Alcohol/week: 0.0 standard drinks    Comment: casual drinker  . Drug use: No  . Sexual activity: Not on file  Lifestyle  . Physical activity:    Days per week: Not on file    Minutes per session: Not on file  . Stress: Not on file  Relationships   . Social connections:    Talks on phone: Not on file    Gets together: Not on file    Attends religious service: Not on file    Active member of club or organization: Not on file    Attends meetings of clubs or organizations: Not on file    Relationship status: Not on file  Other Topics Concern  . Not on file  Social History Narrative  . Not on file    Past Medical History:  Diagnosis Date  . Decreased libido   . Diabetes mellitus without complication ([place])   . Hypertension   . Hypoxia   . Morbid obesity [facility])      Patient Active Problem List   Diagnosis Date Noted  . Hypoxia 02/08/2015  . Hypotestosteronism 02/08/2015  . Abnormal prostate specific antigen 02/08/2015  . Decreased libido 02/08/2015  . Extreme obesity 12/11/2007  . Diabetes mellitus type 2, uncontrolled ([place]) 09/02/2004  . Hypertension 09/02/2004  . HLD (hyperlipidemia) 09/02/2004    Past Surgical History:  Procedure Laterality Date  . [place]  . WISDOM TOOTH EXTRACTION      Family History        Family Status  Relation Name Status  . Mother  Alive       broken neck for 20 yrs  . Father  Deceased at age 38       multiple sclerosis        His family history includes Diabetes in his mother.      Allergies  Allergen Reactions  . Aspirin Other (See Comments)    Hyper     Current Outpatient Medications:  .  aspirin 81 MG tablet, , Disp: , Rfl:  .  Cyanocobalamin 1500 MCG TBDP, Take 1 tablet by mouth daily., Disp: , Rfl:  .  felodipine (PLENDIL) 10 MG 24 hr tablet, TAKE 1 TABLETBY MOUTH DAILY  FOR BLOOD PRESSURE, Disp: 60 tablet, Rfl: 3 .  JARDIANCE 25 MG TABS tablet, TAKE ONE TABLET BY MOUTH DAILY, Disp: 30 tablet, Rfl: 11 .  lisinopril-hydrochlorothiazide (PRINZIDE,ZESTORETIC) 20-25 MG tablet, TAKE 2 TABLETS BY MOUTH DAILY., Disp: 120 tablet, Rfl: 3 .  metFORMIN (GLUCOPHAGE-XR) 500 MG 24 hr tablet, TAKE 2 TABLETS (1,000 MG TOTAL) BY MOUTH DAILY., Disp: 180 tablet, Rfl: 4 .   metoprolol succinate (TOPROL-XL) 100 MG 24 hr tablet, TAKE 1 TABLET (100 MG TOTAL) BY MOUTH DAILY., Disp: 90 tablet, Rfl: 4 .  MULTIPLE VITAMIN PO, , Disp: , Rfl:  .  OMEGA-3 FATTY ACIDS PO, Take 1,200 mcg by mouth. , Disp: , Rfl:  .  potassium chloride SA (K-DUR,KLOR-CON) 20 MEQ tablet, TAKE 1 TABLET BY MOUTH EVERY MORNING, Disp: 30 tablet, Rfl: 3 .  simvastatin (ZOCOR) 40 MG tablet, TAKE ONE TABLET BY MOUTH EVERY NIGHT AT BEDTIME, Disp: 90 tablet, Rfl: 4   Patient Care Team: Birdie Sons, MD as PCP - General (Family Medicine) Royston Cowper, MD (Urology) Phineas Douglas  J, OD (Optometry)      Objective:     Vitals:   05/25/18 0900  BP: (!) 149/74  Pulse: 83  Resp: 16  Temp: 98 F (36.7 C)  TempSrc: Oral  SpO2: 96%  Weight: 282 lb (127.9 kg)  Height: 5\' 11"  (1.803 m)     Physical Exam   General Appearance:    Alert, cooperative, no distress, appears stated age  Head:    Normocephalic, without obvious abnormality, atraumatic  Eyes:    PERRL, conjunctiva/corneas clear, EOM's intact, fundi    benign, both eyes       Ears:    Normal TM's and external ear canals, both ears  Nose:   Nares normal, septum midline, mucosa normal, no drainage   or sinus tenderness  Throat:   Lips, mucosa, and tongue normal; teeth and gums normal  Neck:   Supple, symmetrical, trachea midline, no adenopathy;       thyroid:  No enlargement/tenderness/nodules; no carotid   bruit or JVD  Back:     Symmetric, no curvature, ROM normal, no CVA tenderness  Lungs:     Clear to auscultation bilaterally, respirations unlabored  Chest wall:    No tenderness or deformity  Heart:    Regular rate and rhythm, S1 and S2 normal, no murmur, rub   or gallop  Abdomen:     Soft, non-tender, bowel sounds active all four quadrants,    no masses, no organomegaly  Genitalia:    deferred  Rectal:    deferred  Extremities:   Extremities normal, atraumatic, no cyanosis or edema  Pulses:   2+ and symmetric all  extremities  Skin:   Skin color, texture, turgor normal, no rashes or lesions  Lymph nodes:   Cervical, supraclavicular, and axillary nodes normal  Neurologic:   CNII-XII intact. Normal strength, sensation and reflexes      throughout    Depression Screen PHQ 2/9 Scores 05/25/2018 05/19/2017 05/16/2016 05/16/2015  PHQ - 2 Score 0 0 0 0  PHQ- 9 Score 0 1 0 0      Assessment & Plan:     Routine Health Maintenance and Physical Exam  Exercise Activities and Dietary recommendations Goals   None     Immunization History  Administered Date(s) Administered  . Pneumococcal Polysaccharide-23 06/01/2013  . Td 02/14/2012  . Tdap 05/27/2007    Health Maintenance  Topic Date Due  . FOOT EXAM  05/03/1972  . HIV Screening  05/03/1977  . INFLUENZA VACCINE  04/02/2018  . OPHTHALMOLOGY EXAM  04/02/2018  . HEMOGLOBIN A1C  07/08/2018  . TETANUS/TDAP  02/13/2022  . COLONOSCOPY  05/22/2027  . PNEUMOCOCCAL POLYSACCHARIDE VACCINE AGE 38-64 HIGH RISK  Completed  . Hepatitis C Screening  Completed     Discussed health benefits of physical activity, and encouraged him to engage in regular exercise appropriate for his age and condition.    --------------------------------------------------------------------  1. Annual physical exam   2. Hyperlipidemia, unspecified hyperlipidemia type He is tolerating simvastatin well with no adverse effects.   - Comprehensive metabolic panel - Lipid panel  3. Essential hypertension Usually well controlled. BP mildly elevated today. Continue current medications.   - EKG 12-Lead  4. Uncontrolled type 2 diabetes mellitus with hypoglycemia without coma ([place]) Improved with increase dose of Jardiance. Continue current medications.   - POCT UA - Microalbumin (patient unable to provide urine sample today) - Hemoglobin A1c - Hepatitis A vaccine adult IM  5. Morbid obesity ([place]) Doing well  losing weight with dietary improvements and Jardiance. Continue  current medications.  Goal weight <220 pounds  6. Prostate cancer screening  - PSA    Lelon Huh, MD  [facility]

## 2018-05-25 NOTE — Patient Instructions (Addendum)
.   I recommend that you get a flu vaccine this year. Please call our office at [phone] at your earliest convenience to schedule a flu shot.    The CDC recommends two doses of Shingrix (the shingles vaccine) separated by 2 to 6 months for adults age 56 years and older. I recommend checking with your insurance plan regarding coverage for this vaccine.

## 2018-05-26 LAB — COMPREHENSIVE METABOLIC PANEL
A/G RATIO: 1.7 (ref 1.2–2.2)
ALK PHOS: 89 IU/L (ref 39–117)
ALT: 19 IU/L (ref 0–44)
AST: 17 IU/L (ref 0–40)
Albumin: 4.2 g/dL (ref 3.5–5.5)
BILIRUBIN TOTAL: 0.5 mg/dL (ref 0.0–1.2)
BUN/Creatinine Ratio: 20 (ref 9–20)
BUN: 18 mg/dL (ref 6–24)
CO2: 27 mmol/L (ref 20–29)
Calcium: 9.9 mg/dL (ref 8.7–10.2)
Chloride: 100 mmol/L (ref 96–106)
Creatinine, Ser: 0.9 mg/dL (ref 0.76–1.27)
GFR calc Af Amer: 110 mL/min/{1.73_m2} (ref >59)
GFR calc non Af Amer: 95 mL/min/{1.73_m2} (ref >59)
GLOBULIN, TOTAL: 2.5 g/dL (ref 1.5–4.5)
Glucose: 138 mg/dL — ABNORMAL HIGH (ref 65–99)
Potassium: 3.5 mmol/L (ref 3.5–5.2)
Sodium: 142 mmol/L (ref 134–144)
Total Protein: 6.7 g/dL (ref 6.0–8.5)

## 2018-05-26 LAB — LIPID PANEL
CHOLESTEROL TOTAL: 145 mg/dL (ref 100–199)
Chol/HDL Ratio: 4 ratio (ref 0.0–5.0)
HDL: 36 mg/dL — ABNORMAL LOW (ref >39)
LDL CALC: 78 mg/dL (ref 0–99)
TRIGLYCERIDES: 153 mg/dL — AB (ref 0–149)
VLDL Cholesterol Cal: 31 mg/dL (ref 5–40)

## 2018-05-26 LAB — PSA: Prostate Specific Ag, Serum: 1.1 ng/mL (ref 0.0–4.0)

## 2018-05-26 LAB — HEMOGLOBIN A1C
Est. average glucose Bld gHb Est-mCnc: 174 mg/dL
Hgb A1c MFr Bld: 7.7 % — ABNORMAL HIGH (ref 4.8–5.6)

## 2018-05-27 ENCOUNTER — Other Ambulatory Visit: Payer: Self-pay | Admitting: *Deleted

## 2018-05-27 MED ORDER — FELODIPINE ER 10 MG PO TB24: ORAL_TABLET | ORAL | 5 refills | Status: DC

## 2018-06-17 ENCOUNTER — Other Ambulatory Visit: Payer: Self-pay | Admitting: Family Medicine

## 2018-08-11 ENCOUNTER — Telehealth: Payer: Self-pay

## 2018-08-11 DIAGNOSIS — Z1211 Encounter for screening for malignant neoplasm of colon: Secondary | ICD-10-CM

## 2018-08-11 NOTE — Telephone Encounter (Signed)
Patient called office requesting order for colonoscopy screening. KW

## 2018-08-12 ENCOUNTER — Telehealth: Payer: Self-pay | Admitting: Family Medicine

## 2018-08-12 NOTE — Telephone Encounter (Signed)
Patient called stating he needed a colonoscopy before the end of this year.   His Restaurant manager, fast food just called him yesterday telling him this and it's so his insurance premium will not increase tremendously next year.

## 2018-08-12 NOTE — Telephone Encounter (Signed)
Referral order was put in yesterday. Can give him the phone number to [facility] so he can call and see if they can schedule before end of year.   If not he could do a stool OC-Lyte for colon cancer screening.

## 2018-08-12 NOTE — Telephone Encounter (Signed)
Pt advised.  He is going to call [facility], and he is also going to check with his HR to see if an OC Light is okay.   Thanks,   -Mickel Baas

## 2018-08-13 ENCOUNTER — Other Ambulatory Visit: Payer: Self-pay

## 2018-08-13 DIAGNOSIS — Z1211 Encounter for screening for malignant neoplasm of colon: Secondary | ICD-10-CM

## 2018-08-13 MED ORDER — NA SULFATE-K SULFATE-MG SULF 17.5-3.13-1.6 GM/177ML PO SOLN: 1.0000 | Freq: Once | ORAL | 0 refills | Status: AC

## 2018-08-13 NOTE — Telephone Encounter (Signed)
Patient is scheduled to have Colonoscopy with [facility] 08/18/2018.

## 2018-08-18 ENCOUNTER — Ambulatory Visit
Admission: RE | Admit: 2018-08-18 | Discharge: 2018-08-18 | Disposition: A | Discharge status: Home / Self Care | Payer: PRIVATE HEALTH INSURANCE | Attending: Gastroenterology | Admitting: Gastroenterology

## 2018-08-18 ENCOUNTER — Encounter
Admission: RE | Disposition: A | Discharge status: Home / Self Care | Payer: Self-pay | Source: Home / Self Care | Attending: Gastroenterology

## 2018-08-18 ENCOUNTER — Ambulatory Visit: Payer: PRIVATE HEALTH INSURANCE | Admitting: Anesthesiology

## 2018-08-18 DIAGNOSIS — Z800 Family history of malignant neoplasm of digestive organs: Secondary | ICD-10-CM | POA: Diagnosis not present

## 2018-08-18 DIAGNOSIS — I10 Essential (primary) hypertension: Secondary | ICD-10-CM | POA: Insufficient documentation

## 2018-08-18 DIAGNOSIS — E119 Type 2 diabetes mellitus without complications: Secondary | ICD-10-CM | POA: Diagnosis not present

## 2018-08-18 DIAGNOSIS — Z7984 Long term (current) use of oral hypoglycemic drugs: Secondary | ICD-10-CM | POA: Insufficient documentation

## 2018-08-18 DIAGNOSIS — Z7982 Long term (current) use of aspirin: Secondary | ICD-10-CM | POA: Insufficient documentation

## 2018-08-18 DIAGNOSIS — K5730 Diverticulosis of large intestine without perforation or abscess without bleeding: Secondary | ICD-10-CM | POA: Diagnosis not present

## 2018-08-18 DIAGNOSIS — Z720 Tobacco use: Secondary | ICD-10-CM | POA: Diagnosis not present

## 2018-08-18 DIAGNOSIS — K640 First degree hemorrhoids: Secondary | ICD-10-CM | POA: Insufficient documentation

## 2018-08-18 DIAGNOSIS — D122 Benign neoplasm of ascending colon: Secondary | ICD-10-CM | POA: Diagnosis not present

## 2018-08-18 DIAGNOSIS — Z1211 Encounter for screening for malignant neoplasm of colon: Secondary | ICD-10-CM | POA: Insufficient documentation

## 2018-08-18 DIAGNOSIS — Z6837 Body mass index (BMI) 37.0-37.9, adult: Secondary | ICD-10-CM | POA: Diagnosis not present

## 2018-08-18 DIAGNOSIS — Z79899 Other long term (current) drug therapy: Secondary | ICD-10-CM | POA: Diagnosis not present

## 2018-08-18 HISTORY — PX: COLONOSCOPY WITH PROPOFOL: SHX5780

## 2018-08-18 LAB — GLUCOSE, CAPILLARY: Glucose-Capillary: 184 mg/dL — ABNORMAL HIGH (ref 70–99)

## 2018-08-18 SURGERY — COLONOSCOPY WITH PROPOFOL
Anesthesia: General

## 2018-08-18 MED ORDER — SODIUM CHLORIDE 0.9 % IV SOLN
INTRAVENOUS | Status: DC
  Administered 2018-08-18: 1000 mL via INTRAVENOUS

## 2018-08-18 MED ORDER — LIDOCAINE 2% (20 MG/ML) 5 ML SYRINGE
INTRAMUSCULAR | Status: DC | PRN
  Administered 2018-08-18: 50 mg via INTRAVENOUS

## 2018-08-18 MED ORDER — PROPOFOL 500 MG/50ML IV EMUL
INTRAVENOUS | Status: AC
  Filled 2018-08-18: qty 50

## 2018-08-18 MED ORDER — PROPOFOL 10 MG/ML IV BOLUS
INTRAVENOUS | Status: DC | PRN
  Administered 2018-08-18: 120 mg via INTRAVENOUS

## 2018-08-18 MED ORDER — PROPOFOL 500 MG/50ML IV EMUL
INTRAVENOUS | Status: DC | PRN
  Administered 2018-08-18: 200 ug/kg/min via INTRAVENOUS

## 2018-08-18 NOTE — H&P (Signed)
Daniel Bellows, MD [address] [place], [place], [place], [address] [address], [place], [place], [place], [address] Phone: [phone]  Fax: [phone]  Primary Care Physician:  Birdie Sons, MD   Pre-Procedure History & Physical: HPI:  Daniel Alvarado is a 57 y.o. male is here for an colonoscopy.   Past Medical History:  Diagnosis Date  . Decreased libido   . Diabetes mellitus without complication ([place])   . Hypertension   . Hypoxia   . Morbid obesity ([place])     Past Surgical History:  Procedure Laterality Date  . [place]  . WISDOM TOOTH EXTRACTION      Prior to Admission medications   Medication Sig Start Date End Date Taking? Authorizing Provider  aspirin 81 MG tablet  12/16/08  Yes [provider]  Cyanocobalamin 1500 MCG TBDP Take 1 tablet by mouth daily.   Yes [provider]  felodipine (PLENDIL) 10 MG 24 hr tablet TAKE 1 TABLETBY MOUTH DAILY  FOR BLOOD PRESSURE 05/27/18  Yes Birdie Sons, MD  JARDIANCE 25 MG TABS tablet TAKE ONE TABLET BY MOUTH DAILY 05/03/18  Yes Birdie Sons, MD  lisinopril-hydrochlorothiazide (PRINZIDE,ZESTORETIC) 20-25 MG tablet TAKE 2 TABLETS BY MOUTH DAILY. 06/17/18  Yes Birdie Sons, MD  metFORMIN (GLUCOPHAGE-XR) 500 MG 24 hr tablet TAKE 2 TABLETS (1,000 MG TOTAL) BY MOUTH DAILY. 01/01/18  Yes Birdie Sons, MD  metoprolol succinate (TOPROL-XL) 100 MG 24 hr tablet TAKE 1 TABLET (100 MG TOTAL) BY MOUTH DAILY. 10/14/17  Yes Birdie Sons, MD  MULTIPLE VITAMIN PO  10/31/05  Yes [provider]  OMEGA-3 FATTY ACIDS PO Take 1,200 mcg by mouth.  08/10/07  Yes [provider]  potassium chloride SA (K-DUR,KLOR-CON) 20 MEQ tablet TAKE 1 TABLET BY MOUTH EVERY MORNING 05/17/18  Yes Birdie Sons, MD  simvastatin (ZOCOR) 40 MG tablet TAKE ONE TABLET BY MOUTH EVERY NIGHT AT BEDTIME 10/14/17  Yes Birdie Sons, MD    Allergies as of 08/13/2018 - Review Complete 05/25/2018    Allergen Reaction Noted  . Aspirin Other (See Comments) 02/09/2015    Family History  Problem Relation Age of Onset  . Diabetes Mother     Social History   Socioeconomic History  . Marital status: Married    Spouse name: Not on file  . Number of children: 4  . Years of education: Not on file  . 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 Financial resource strain: Not on file  . Food insecurity:    Worry: Not on file    Inability: Not on file  . Transportation needs:    Medical: Not on file    Non-medical: Not on file  Tobacco Use  . Smoking status: Never Smoker  . Smokeless tobacco: Current User    Types: Snuff  . Tobacco comment: uses dip  Substance and Sexual Activity  . Alcohol use: Yes    Alcohol/week: 0.0 standard drinks    Comment: casual drinker  . Drug use: No  . Sexual activity: Not on file  Lifestyle  . Physical activity:    Days per week: Not on file    Minutes per session: Not on file  . Stress: Not on file  Relationships  . Social connections:    Talks on phone: Not on  file    Gets together: Not on file    Attends religious service: Not on file    Active member of club or organization: Not on file    Attends meetings of clubs or organizations: Not on file    Relationship status: Not on file  . Intimate partner violence:    Fear of current or ex partner: Not on file    Emotionally abused: Not on file    Physically abused: Not on file    Forced sexual activity: Not on file  Other Topics Concern  . Not on file  Social History Narrative  . Not on file    Review of Systems: See HPI, otherwise negative ROS  Physical Exam: BP (!) 154/81   Pulse 83   Temp (!) 96.7 F (35.9 C) (Tympanic)   Resp 16   Ht 5\' 11"  (1.803 m)   Wt 122 kg   SpO2 98%   BMI 37.52 kg/m  General:   Alert,  pleasant and cooperative in NAD Head:   Normocephalic and atraumatic. Neck:  Supple; no masses or thyromegaly. Lungs:  Clear throughout to auscultation, normal respiratory effort.    Heart:  +S1, +S2, Regular rate and rhythm, No edema. Abdomen:  Soft, nontender and nondistended. Normal bowel sounds, without guarding, and without rebound.   Neurologic:  Alert and  oriented x4;  grossly normal neurologically.  Impression/Plan: Daniel Alvarado is here for an colonoscopy to be performed for colon cancer in her mother  Risks, benefits, limitations, and alternatives regarding  colonoscopy have been reviewed with the patient.  Questions have been answered.  All parties agreeable.   Daniel Bellows, MD  08/18/2018, 9:55 AM

## 2018-08-18 NOTE — Op Note (Signed)
[facility] Gastroenterology Patient Name: Daniel Alvarado Procedure Date: 08/18/2018 9:53 AM MRN: [id number] Account #: [id number] Date of Birth: 03-02-1961 Admit Type: Outpatient Age: 57 Room: [facility] ENDO ROOM 1 Gender: Male Note Status: Finalized Procedure:            Colonoscopy Indications:          Screening in patient at increased risk: Family history                        of 1st-degree relative with colorectal cancer Providers:            Jonathon Bellows MD, MD Referring MD:         Kirstie Peri. Caryn Section, MD (Referring MD) Medicines:            Monitored Anesthesia Care Complications:        No immediate complications. Procedure:            Pre-Anesthesia Assessment:                       - Prior to the procedure, a History and Physical was                        performed, and patient medications, allergies and                        sensitivities were reviewed. The patient's tolerance of                        previous anesthesia was reviewed.                       - The risks and benefits of the procedure and the                        sedation options and risks were discussed with the                        patient. All questions were answered and informed                        consent was obtained.                       - ASA Grade Assessment: II - A patient with mild                        systemic disease.                       After obtaining informed consent, the colonoscope was                        passed under direct vision. Throughout the procedure,                        the patient's blood pressure, pulse, and oxygen                        saturations were monitored continuously. The  Colonoscope was introduced through the anus and                        advanced to the the cecum, identified by the                        appendiceal orifice, IC valve and transillumination.                        The colonoscopy was performed  with ease. The patient                        tolerated the procedure well. The quality of the bowel                        preparation was good. Findings:      The perianal and digital rectal examinations were normal.      Non-bleeding internal hemorrhoids were found during retroflexion. The       hemorrhoids were medium-sized and Grade I (internal hemorrhoids that do       not prolapse).      Multiple medium-mouthed diverticula were found in the left colon.      A 3 mm polyp was found in the ascending colon. The polyp was sessile.       The polyp was removed with a cold biopsy forceps. Resection and       retrieval were complete.      The exam was otherwise without abnormality on direct and retroflexion       views. Impression:           - Non-bleeding internal hemorrhoids.                       - Diverticulosis in the left colon.                       - One 3 mm polyp in the ascending colon, removed with a                        cold biopsy forceps. Resected and retrieved.                       - The examination was otherwise normal on direct and                        retroflexion views. Recommendation:       - Discharge patient to home (with escort).                       - Resume previous diet.                       - Continue present medications.                       - Await pathology results.                       - Repeat colonoscopy in 5 years for surveillance. Procedure Code(s):    --- Professional ---  45380, Colonoscopy, flexible; with biopsy, single or                        multiple Diagnosis Code(s):    --- Professional ---                       Z80.0, Family history of malignant neoplasm of                        digestive organs                       K64.0, First degree hemorrhoids                       D12.2, Benign neoplasm of ascending colon                       K57.30, Diverticulosis of large intestine without                         perforation or abscess without bleeding CPT copyright 2018 American Medical Association. All rights reserved. The codes documented in this report are preliminary and upon coder review may  be revised to meet current compliance requirements. Jonathon Bellows, MD Jonathon Bellows MD, MD 08/18/2018 10:22:03 AM This report has been signed electronically. Number of Addenda: 0 Note Initiated On: 08/18/2018 9:53 AM Scope Withdrawal Time: 0 hours 8 minutes 58 seconds  Total Procedure Duration: 0 hours 11 minutes 47 seconds       [facility]

## 2018-08-18 NOTE — Transfer of Care (Signed)
Immediate Anesthesia Transfer of Care Note  Patient: Daniel Alvarado  Procedure(s) Performed: COLONOSCOPY WITH PROPOFOL (N/A )  Patient Location: Endoscopy Unit  Anesthesia Type:General  Level of Consciousness: sedated  Airway & Oxygen Therapy: Patient connected to nasal cannula oxygen  Post-op Assessment: Post -op Vital signs reviewed and stable  Post vital signs: stable  Last Vitals:  Vitals Value Taken Time  BP 129/58 08/18/2018 10:24 AM  Temp 36.2 C 08/18/2018 10:20 AM  Pulse 65 08/18/2018 10:25 AM  Resp 14 08/18/2018 10:25 AM  SpO2 97 % 08/18/2018 10:25 AM  Vitals shown include unvalidated device data.  Last Pain:  Vitals:   08/18/18 1020  TempSrc: Tympanic  PainSc:          Complications: No apparent anesthesia complications

## 2018-08-18 NOTE — Anesthesia Preprocedure Evaluation (Signed)
Anesthesia Evaluation  Patient identified by MRN, date of birth, ID band Patient awake    Reviewed: Allergy & Precautions, H&P , NPO status , Patient's Chart, lab work & pertinent test results, reviewed documented beta blocker date and time   Airway Mallampati: II   Neck ROM: full    Dental  (+) Poor Dentition   Pulmonary neg pulmonary ROS,    Pulmonary exam normal        Cardiovascular Exercise Tolerance: Good hypertension, On Medications negative cardio ROS Normal cardiovascular exam Rhythm:regular Rate:Normal     Neuro/Psych PSYCHIATRIC DISORDERS negative neurological ROS     GI/Hepatic negative GI ROS, Neg liver ROS,   Endo/Other  diabetes, Well Controlled, Type 2, Oral Hypoglycemic AgentsMorbid obesity  Renal/GU negative Renal ROS  negative genitourinary   Musculoskeletal   Abdominal   Peds  Hematology negative hematology ROS (+)   Anesthesia Other Findings Past Medical History: No date: Decreased libido No date: Diabetes mellitus without complication (HCC) No date: Hypertension No date: Hypoxia No date: Morbid obesity ([place]) Past Surgical History: 1978: AORTA SURGERY No date: WISDOM TOOTH EXTRACTION BMI    Body Mass Index:  37.52 kg/m     Reproductive/Obstetrics negative OB ROS                             Anesthesia Physical Anesthesia Plan  ASA: III  Anesthesia Plan: General   Post-op Pain Management:    Induction:   PONV Risk Score and Plan:   Airway Management Planned:   Additional Equipment:   Intra-op Plan:   Post-operative Plan:   Informed Consent: I have reviewed the patients History and Physical, chart, labs and discussed the procedure including the risks, benefits and alternatives for the proposed anesthesia with the patient or authorized representative who has indicated his/her understanding and acceptance.   Dental Advisory Given  Plan Discussed  with: CRNA  Anesthesia Plan Comments:         Anesthesia Quick Evaluation

## 2018-08-18 NOTE — Anesthesia Post-op Follow-up Note (Signed)
Anesthesia QCDR form completed.        

## 2018-08-19 ENCOUNTER — Encounter: Payer: Self-pay | Admitting: Family Medicine

## 2018-08-19 ENCOUNTER — Encounter: Payer: Self-pay | Admitting: Gastroenterology

## 2018-08-19 DIAGNOSIS — Z860101 Personal history of adenomatous and serrated colon polyps: Secondary | ICD-10-CM | POA: Insufficient documentation

## 2018-08-19 DIAGNOSIS — Z86010 Personal history of colonic polyps: Secondary | ICD-10-CM | POA: Insufficient documentation

## 2018-08-19 LAB — SURGICAL PATHOLOGY

## 2018-08-19 NOTE — Anesthesia Postprocedure Evaluation (Signed)
Anesthesia Post Note  Patient: Daniel Alvarado  Procedure(s) Performed: COLONOSCOPY WITH PROPOFOL (N/A )  Patient location during evaluation: PACU Anesthesia Type: General Level of consciousness: awake and alert Pain management: pain level controlled Vital Signs Assessment: post-procedure vital signs reviewed and stable Respiratory status: spontaneous breathing, nonlabored ventilation, respiratory function stable and patient connected to nasal cannula oxygen Cardiovascular status: blood pressure returned to baseline and stable Postop Assessment: no apparent nausea or vomiting Anesthetic complications: no     Last Vitals:  Vitals:   08/18/18 1040 08/18/18 1050  BP:  132/68  Pulse: 68 60  Resp: 16 19  Temp:    SpO2: 96% 96%    Last Pain:  Vitals:   08/18/18 1020  TempSrc: Tympanic  PainSc:                  Molli Barrows

## 2018-08-23 ENCOUNTER — Encounter: Payer: Self-pay | Admitting: Gastroenterology

## 2018-09-22 ENCOUNTER — Other Ambulatory Visit: Payer: Self-pay | Admitting: Family Medicine

## 2018-09-28 ENCOUNTER — Encounter: Payer: Self-pay | Admitting: Family Medicine

## 2018-09-28 ENCOUNTER — Ambulatory Visit: Payer: PRIVATE HEALTH INSURANCE | Admitting: Family Medicine

## 2018-09-28 VITALS — BP 120/62 | HR 64 | Temp 98.1°F | Resp 16 | Wt 276.0 lb

## 2018-09-28 DIAGNOSIS — E11649 Type 2 diabetes mellitus with hypoglycemia without coma: Secondary | ICD-10-CM

## 2018-09-28 DIAGNOSIS — I10 Essential (primary) hypertension: Secondary | ICD-10-CM | POA: Diagnosis not present

## 2018-09-28 LAB — POCT GLYCOSYLATED HEMOGLOBIN (HGB A1C): Hemoglobin A1C: 8.6 % — AB (ref 4.0–5.6)

## 2018-09-28 MED ORDER — METOPROLOL SUCCINATE ER 100 MG PO TB24: ORAL_TABLET | ORAL | 4 refills | Status: DC

## 2018-09-28 MED ORDER — METFORMIN HCL ER 500 MG PO TB24: ORAL_TABLET | ORAL | 4 refills | Status: DC

## 2018-09-28 MED ORDER — LISINOPRIL-HYDROCHLOROTHIAZIDE 20-25 MG PO TABS: 2.0000 | ORAL_TABLET | Freq: Every day | ORAL | 4 refills | Status: DC

## 2018-09-28 MED ORDER — POTASSIUM CHLORIDE CRYS ER 20 MEQ PO TBCR: 20.0000 meq | EXTENDED_RELEASE_TABLET | Freq: Every morning | ORAL | 3 refills | Status: DC

## 2018-09-28 MED ORDER — FELODIPINE ER 10 MG PO TB24: ORAL_TABLET | ORAL | 4 refills | Status: DC

## 2018-09-28 NOTE — Progress Notes (Signed)
Patient: Daniel Alvarado Male    DOB: 21-Jul-1961   58 y.o.   MRN: [id number] Visit Date: 09/28/2018  Today's Provider: Lelon Huh, MD   Chief Complaint  Patient presents with  . Hypertension  . Hyperlipidemia  . Diabetes   Subjective:     HPI     Diabetes Mellitus Type II, Follow-up:   Lab Results  Component Value Date   HGBA1C 7.7 (H) 05/25/2018   HGBA1C 8.6 01/05/2018   HGBA1C 7.5 09/29/2017   Last seen for diabetes 4 months ago.  Management since then includes No changes. He reports excellent compliance with treatment. He is not having side effects.  Current symptoms include none and have been stable. Home blood sugar records: Pt does not check his blood sugar at home  Episodes of hypoglycemia? no   Current Insulin Regimen: None Most Recent Eye Exam: 05/2018 Weight trend: decreasing Prior visit with dietician: no Current diet: in general, a "healthy" diet   Current exercise: walking  ------------------------------------------------------------------------   Hypertension, follow-up:  BP Readings from Last 3 Encounters:  09/28/18 120/62  08/18/18 132/68  05/25/18 (!) 149/74    He was last seen for hypertension 4 months ago.  BP at that visit was 149/74. Management since that visit includes No changes He reports excellent compliance with treatment. He is not having side effects.  He is exercising. He is adherent to low salt diet.   Outside blood pressures are Pt checks his BP occasionally he says it is normal. He is experiencing none.  Patient denies chest pain, exertional chest pressure/discomfort, fatigue, lower extremity edema, palpitations and syncope.   Cardiovascular risk factors include advanced age (older than 53 for men, 35 for women), diabetes mellitus, dyslipidemia, hypertension, male gender and obesity (BMI >= 30 kg/m2).  Use of agents associated with hypertension: none.    ------------------------------------------------------------------------    Lipid/Cholesterol, Follow-up:   Last seen for this 4 months ago.  Management since that visit includes No changes.  Last Lipid Panel:    Component Value Date/Time   CHOL 145 05/25/2018 0957   TRIG 153 (H) 05/25/2018 0957   HDL 36 (L) 05/25/2018 0957   CHOLHDL 4.0 05/25/2018 0957   CHOLHDL 3.5 05/19/2017 0918   LDLCALC 78 05/25/2018 0957   LDLCALC 76 05/19/2017 0918    He reports excellent compliance with treatment. He is not having side effects.   Wt Readings from Last 3 Encounters:  09/28/18 276 lb (125.2 kg)  08/18/18 269 lb (122 kg)  05/25/18 282 lb (127.9 kg)    ------------------------------------------------------------------------    Allergies  Allergen Reactions  . Aspirin Other (See Comments)    Hyper     Current Outpatient Medications:  .  aspirin 81 MG tablet, , Disp: , Rfl:  .  felodipine (PLENDIL) 10 MG 24 hr tablet, TAKE 1 TABLETBY MOUTH DAILY  FOR BLOOD PRESSURE, Disp: 60 tablet, Rfl: 5 .  JARDIANCE 25 MG TABS tablet, TAKE ONE TABLET BY MOUTH DAILY, Disp: 30 tablet, Rfl: 11 .  lisinopril-hydrochlorothiazide (PRINZIDE,ZESTORETIC) 20-25 MG tablet, TAKE 2 TABLETS BY MOUTH DAILY., Disp: 120 tablet, Rfl: 4 .  metFORMIN (GLUCOPHAGE-XR) 500 MG 24 hr tablet, TAKE 2 TABLETS (1,000 MG TOTAL) BY MOUTH DAILY., Disp: 180 tablet, Rfl: 4 .  metoprolol succinate (TOPROL-XL) 100 MG 24 hr tablet, TAKE 1 TABLET (100 MG TOTAL) BY MOUTH DAILY., Disp: 90 tablet, Rfl: 4 .  MULTIPLE VITAMIN PO, , Disp: , Rfl:  .  OMEGA-3 FATTY  ACIDS PO, Take 1,200 mcg by mouth. , Disp: , Rfl:  .  potassium chloride SA (K-DUR,KLOR-CON) 20 MEQ tablet, TAKE 1 TABLET BY MOUTH EVERY MORNING, Disp: 30 tablet, Rfl: 2 .  simvastatin (ZOCOR) 40 MG tablet, TAKE ONE TABLET BY MOUTH EVERY NIGHT AT BEDTIME, Disp: 90 tablet, Rfl: 4 .  Cyanocobalamin 1500 MCG TBDP, Take 1 tablet by mouth daily., Disp: , Rfl:   Review of  Systems  Constitutional: Negative.   Respiratory: Negative.   Cardiovascular: Negative.   Gastrointestinal: Negative.   Neurological: Negative for dizziness, light-headedness and headaches.    Social History   Tobacco Use  . Smoking status: Never Smoker  . Smokeless tobacco: Current User    Types: Snuff  . Tobacco comment: uses dip  Substance Use Topics  . Alcohol use: Yes    Alcohol/week: 0.0 standard drinks    Comment: casual drinker      Objective:   BP 120/62 (BP Location: Right Arm, Patient Position: Sitting, Cuff Size: Large)   Pulse 64   Temp 98.1 F (36.7 C) (Oral)   Resp 16   Wt 276 lb (125.2 kg)   SpO2 97%   BMI 38.49 kg/m  Vitals:   09/28/18 0859  BP: 120/62  Pulse: 64  Resp: 16  Temp: 98.1 F (36.7 C)  TempSrc: Oral  SpO2: 97%  Weight: 276 lb (125.2 kg)     Physical Exam  General Appearance:    Alert, cooperative, no distress, obese  Eyes:    PERRL, conjunctiva/corneas clear, EOM's intact       Lungs:     Clear to auscultation bilaterally, respirations unlabored  Heart:    Regular rate and rhythm  Neurologic:   Awake, alert, oriented x 3. No apparent focal neurological           defect.       Results for orders placed or performed in visit on 09/28/18  POCT glycosylated hemoglobin (Hb A1C)  Result Value Ref Range   Hemoglobin A1C 8.6 (A) 4.0 - 5.6 %   HbA1c POC (<> result, manual entry)     HbA1c, POC (prediabetic range)     HbA1c, POC (controlled diabetic range)         Assessment & Plan    1. Uncontrolled type 2 diabetes mellitus with hypoglycemia without coma (HCC) Double metformin to 2 tablets twice daily.  - POCT glycosylated hemoglobin (Hb A1C)  2. Essential hypertension Continue current medications.  Refilled all meds to dispense 90 days.   Return in about 3 months (around 12/28/2018).     Lelon Huh, MD  [facility]

## 2018-09-28 NOTE — Patient Instructions (Addendum)
.   Please review the attached list of medications and notify my office if there are any errors.   . Please bring all of your medications to every appointment so we can make sure that our medication list is the same as yours.    Double dose of metformin to Two tablets TWICE a day

## 2018-11-25 ENCOUNTER — Ambulatory Visit: Payer: PRIVATE HEALTH INSURANCE | Admitting: Family Medicine

## 2019-01-16 ENCOUNTER — Other Ambulatory Visit: Payer: Self-pay | Admitting: Family Medicine

## 2019-01-18 ENCOUNTER — Other Ambulatory Visit: Payer: Self-pay

## 2019-01-18 ENCOUNTER — Encounter: Payer: Self-pay | Admitting: Family Medicine

## 2019-01-18 ENCOUNTER — Ambulatory Visit: Payer: PRIVATE HEALTH INSURANCE | Admitting: Family Medicine

## 2019-01-18 VITALS — BP 122/74 | HR 54 | Temp 98.9°F | Resp 16 | Ht 70.5 in | Wt 274.0 lb

## 2019-01-18 DIAGNOSIS — E119 Type 2 diabetes mellitus without complications: Secondary | ICD-10-CM

## 2019-01-18 LAB — POCT GLYCOSYLATED HEMOGLOBIN (HGB A1C)
Est. average glucose Bld gHb Est-mCnc: 174
Hemoglobin A1C: 7.7 % — AB (ref 4.0–5.6)

## 2019-01-18 MED ORDER — EMPAGLIFLOZIN 25 MG PO TABS: 25.0000 mg | ORAL_TABLET | Freq: Every day | ORAL | 4 refills | Status: DC

## 2019-01-18 NOTE — Progress Notes (Signed)
Patient: Daniel Alvarado Male    DOB: [birth year]   58 y.o.   MRN: [id number] Visit Date: 01/18/2019  Today's Provider: Lelon Huh, MD   Chief Complaint  Patient presents with  . Diabetes  . Hypertension   Subjective:     HPI  Diabetes Mellitus Type II, Follow-up:   Lab Results  Component Value Date   HGBA1C 7.7 (A) 01/18/2019   HGBA1C 8.6 (A) 09/28/2018   HGBA1C 7.7 (H) 05/25/2018    Last seen for diabetes 4 months ago.  Management since then includes double metformin to 2 tablets twice daily. He reports good compliance with treatment. He is not having side effects.  Current symptoms include none and have been stable. Home blood sugar records: not being checked.   Episodes of hypoglycemia? no   Current Insulin Regimen: none Most Recent Eye Exam: up to date Weight trend: stable Prior visit with dietician: No Current exercise: no regular exercise Current diet habits: well balanced  Pertinent Labs:    Component Value Date/Time   CHOL 145 05/25/2018 0957   TRIG 153 (H) 05/25/2018 0957   HDL 36 (L) 05/25/2018 0957   LDLCALC 78 05/25/2018 0957   LDLCALC 76 05/19/2017 0918   CREATININE 0.90 05/25/2018 0957   CREATININE 0.77 05/19/2017 0918    Wt Readings from Last 3 Encounters:  01/18/19 274 lb (124.3 kg)  09/28/18 276 lb (125.2 kg)  08/18/18 269 lb (122 kg)    Hypertension, follow-up:  BP Readings from Last 3 Encounters:  01/18/19 122/74  09/28/18 120/62  08/18/18 132/68    He was last seen for hypertension 4 months ago.  BP at that visit was 120/62. Management since that visit includes no changes. He reports good compliance with treatment. He is not having side effects.  He is not exercising. He is adherent to low salt diet.   Outside blood pressures are checked occasionally. He is experiencing none.  Patient denies exertional chest pressure/discomfort, lower extremity edema and palpitations.     Allergies  Allergen Reactions  .  Aspirin Other (See Comments)    Hyper     Current Outpatient Medications:  .  aspirin 81 MG tablet, , Disp: , Rfl:  .  Cyanocobalamin 1500 MCG TBDP, Take 1 tablet by mouth daily., Disp: , Rfl:  .  felodipine (PLENDIL) 10 MG 24 hr tablet, TAKE 1 TABLETBY MOUTH DAILY  FOR BLOOD PRESSURE, Disp: 90 tablet, Rfl: 4 .  JARDIANCE 25 MG TABS tablet, TAKE ONE TABLET BY MOUTH DAILY, Disp: 30 tablet, Rfl: 11 .  lisinopril-hydrochlorothiazide (PRINZIDE,ZESTORETIC) 20-25 MG tablet, Take 2 tablets by mouth daily., Disp: 180 tablet, Rfl: 4 .  metFORMIN (GLUCOPHAGE-XR) 500 MG 24 hr tablet, TAKE 2 TABLETS (1,000 MG TOTAL) BY TWICE DAILY., Disp: 360 tablet, Rfl: 4 .  metoprolol succinate (TOPROL-XL) 100 MG 24 hr tablet, TAKE 1 TABLET (100 MG TOTAL) BY MOUTH DAILY., Disp: 90 tablet, Rfl: 4 .  MULTIPLE VITAMIN PO, , Disp: , Rfl:  .  OMEGA-3 FATTY ACIDS PO, Take 1,200 mcg by mouth. , Disp: , Rfl:  .  potassium chloride SA (K-DUR,KLOR-CON) 20 MEQ tablet, Take 1 tablet (20 mEq total) by mouth every morning., Disp: 90 tablet, Rfl: 3 .  simvastatin (ZOCOR) 40 MG tablet, TAKE ONE TABLET BY MOUTH EVERY NIGHT AT BEDTIME, Disp: 90 tablet, Rfl: 3  Review of Systems  Constitutional: Negative for activity change, chills, diaphoresis, fatigue and fever.  Respiratory: Negative for cough and shortness  of breath.   Cardiovascular: Negative for chest pain, palpitations and leg swelling.  Endocrine: Negative for cold intolerance, heat intolerance, polydipsia, polyphagia and polyuria.  Musculoskeletal: Negative for arthralgias, joint swelling, myalgias, neck pain and neck stiffness.  Neurological: Negative for dizziness, light-headedness and headaches.    Social History   Tobacco Use  . Smoking status: Never Smoker  . Smokeless tobacco: Current User    Types: Snuff  . Tobacco comment: uses dip  Substance Use Topics  . Alcohol use: Yes    Alcohol/week: 0.0 standard drinks    Comment: casual drinker      Objective:    BP 122/74 (BP Location: Left Arm, Patient Position: Sitting, Cuff Size: Large)   Pulse (!) 54   Temp 98.9 F (37.2 C)   Resp 16   Ht 5' 10.5" (1.791 m)   Wt 274 lb (124.3 kg)   SpO2 94%   BMI 38.76 kg/m  Vitals:   01/18/19 0833  BP: 122/74  Pulse: (!) 54  Resp: 16  Temp: 98.9 F (37.2 C)  SpO2: 94%  Weight: 274 lb (124.3 kg)  Height: 5' 10.5" (1.791 m)     Physical Exam  General appearance: alert, well developed, well nourished, cooperative and in no distress Head: Normocephalic, without obvious abnormality, atraumatic Respiratory: Respirations even and unlabored, normal respiratory rate Extremities: No gross deformities Skin: Skin color, texture, turgor normal. No rashes seen  Psych: Appropriate mood and affect. Neurologic: Mental status: Alert, oriented to person, place, and time, thought content appropriate. Results for orders placed or performed in visit on 01/18/19  POCT glycosylated hemoglobin (Hb A1C)  Result Value Ref Range   Hemoglobin A1C 7.7 (A) 4.0 - 5.6 %   HbA1c POC (<> result, manual entry)     HbA1c, POC (prediabetic range)     HbA1c, POC (controlled diabetic range)     Est. average glucose Bld gHb Est-mCnc 174        Assessment & Plan    1. Type 2 diabetes mellitus with hypoglycemia without coma ([place]) Improving on increased dose of metformin which he is tolerating well. He has been off of B12 supplement due to metallic taste in mouth. Advised he should consider starting back on B12 at a lower dose due to effect of metformin on B12 levels.  - POCT glycosylated hemoglobin (Hb A1C)  Return in 4 months for CPE    Lelon Huh, MD  [facility]

## 2019-01-18 NOTE — Patient Instructions (Signed)
.   Please review the attached list of medications and notify my office if there are any errors.   . Please bring all of your medications to every appointment so we can make sure that our medication list is the same as yours.   

## 2019-05-24 LAB — HM DIABETES EYE EXAM

## 2019-06-01 ENCOUNTER — Other Ambulatory Visit: Payer: Self-pay

## 2019-06-01 ENCOUNTER — Ambulatory Visit (INDEPENDENT_AMBULATORY_CARE_PROVIDER_SITE_OTHER): Payer: PRIVATE HEALTH INSURANCE | Admitting: Family Medicine

## 2019-06-01 ENCOUNTER — Encounter: Payer: Self-pay | Admitting: Family Medicine

## 2019-06-01 VITALS — BP 133/77 | HR 60 | Temp 97.3°F | Resp 16 | Ht 71.0 in | Wt 276.8 lb

## 2019-06-01 DIAGNOSIS — Z0000 Encounter for general adult medical examination without abnormal findings: Secondary | ICD-10-CM | POA: Diagnosis not present

## 2019-06-01 DIAGNOSIS — E785 Hyperlipidemia, unspecified: Secondary | ICD-10-CM | POA: Diagnosis not present

## 2019-06-01 DIAGNOSIS — Z23 Encounter for immunization: Secondary | ICD-10-CM

## 2019-06-01 DIAGNOSIS — Z125 Encounter for screening for malignant neoplasm of prostate: Secondary | ICD-10-CM

## 2019-06-01 DIAGNOSIS — I10 Essential (primary) hypertension: Secondary | ICD-10-CM | POA: Diagnosis not present

## 2019-06-01 NOTE — Progress Notes (Signed)
Patient: Daniel Alvarado, Male    DOB: 1961/01/26, 58 y.o.   MRN: [id number] Visit Date: 06/01/2019  Today's Provider: Lelon Huh, MD   Chief Complaint  Patient presents with  . Annual Exam  . Diabetes  . Hypertension  . Hyperlipidemia   Subjective:     Annual physical exam Daniel Alvarado is a 58 y.o. male who presents today for health maintenance and complete physical. He feels well. He reports exercising active with daily activities. He reports he is sleeping well.  ----------------------------------------------------------------- Follow up hypertension, diabetes, lipids. Denies hypoglycemia. No chest pains or heart flutters, no dyspnea. He does feel tingling in feet at night occasionally.  Lab Results  Component Value Date   CHOL 145 05/25/2018   HDL 36 (L) 05/25/2018   LDLCALC 78 05/25/2018   TRIG 153 (H) 05/25/2018   CHOLHDL 4.0 05/25/2018   Lab Results  Component Value Date   HGBA1C 7.7 (A) 01/18/2019   HGBA1C 8.6 (A) 09/28/2018   HGBA1C 7.7 (H) 05/25/2018   Lab Results  Component Value Date   NA 142 05/25/2018   K 3.5 05/25/2018   CL 100 05/25/2018   CO2 27 05/25/2018   GLUCOSE 138 (H) 05/25/2018   BUN 18 05/25/2018   CREATININE 0.90 05/25/2018   CALCIUM 9.9 05/25/2018   GFRNONAA 95 05/25/2018   GFRAA 110 05/25/2018     Review of Systems  Constitutional: Negative.   HENT: Negative.   Eyes: Negative.   Respiratory: Negative.   Cardiovascular: Negative.   Gastrointestinal: Negative.   Endocrine: Negative.   Genitourinary: Negative.   Musculoskeletal: Negative.   Skin: Negative.   Allergic/Immunologic: Negative.   Neurological: Negative.   Hematological: Negative.   Psychiatric/Behavioral: Negative.     Social History      He  reports that he has never smoked. His smokeless tobacco use includes snuff. He reports current alcohol use. He reports that he does not use drugs.       Social History   Socioeconomic History  .  Marital status: Married    Spouse name: Not on file  . Number of children: 4  . Years of education: Not on file  . 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 Financial resource strain: Not on file  . Food insecurity    Worry: Not on file    Inability: Not on file  . Transportation needs    Medical: Not on file    Non-medical: Not on file  Tobacco Use  . Smoking status: Never Smoker  . Smokeless tobacco: Current User    Types: Snuff  . Tobacco comment: uses dip  Substance and Sexual Activity  . Alcohol use: Yes    Alcohol/week: 0.0 standard drinks    Comment: casual drinker  . Drug use: No  . Sexual activity: Not on file  Lifestyle  . Physical activity    Days per week: Not on file    Minutes per session: Not on file  . Stress: Not on file  Relationships  . Social Herbalist on phone: Not on file    Gets together: Not on file    Attends religious service: Not on file    Active member of club or organization: Not on file    Attends meetings of clubs or organizations: Not on file  Relationship status: Not on file  Other Topics Concern  . Not on file  Social History Narrative  . Not on file    Past Medical History:  Diagnosis Date  . Decreased libido   . Diabetes mellitus without complication ([place])   . Hypertension   . Hypoxia   . Morbid obesity [facility])      Patient Active Problem List   Diagnosis Date Noted  . History of adenomatous polyp of colon 08/19/2018  . Hypoxia 02/08/2015  . Hypotestosteronism 02/08/2015  . Abnormal prostate specific antigen 02/08/2015  . Decreased libido 02/08/2015  . Morbid obesity ([place]) 12/11/2007  . Diabetes mellitus type 2, uncontrolled ([place]) 09/02/2004  . Hypertension 09/02/2004  . HLD (hyperlipidemia) 09/02/2004    Past Surgical History:  Procedure Laterality Date  . [place]  .  COLONOSCOPY WITH PROPOFOL N/A 08/18/2018   Procedure: COLONOSCOPY WITH PROPOFOL;  Surgeon: Jonathon Bellows, MD;  Location: [facility] ENDOSCOPY;  Service: Gastroenterology;  Laterality: N/A;  . WISDOM TOOTH EXTRACTION      Family History        Family Status  Relation Name Status  . Mother  Alive       broken neck for 20 yrs  . Father  Deceased at age 23       multiple sclerosis        His family history includes Diabetes in his mother.      Allergies  Allergen Reactions  . Aspirin Other (See Comments)    Hyper     Current Outpatient Medications:  .  aspirin 81 MG tablet, , Disp: , Rfl:  .  empagliflozin (JARDIANCE) 25 MG TABS tablet, Take 25 mg by mouth daily., Disp: 90 tablet, Rfl: 4 .  felodipine (PLENDIL) 10 MG 24 hr tablet, TAKE 1 TABLETBY MOUTH DAILY  FOR BLOOD PRESSURE, Disp: 90 tablet, Rfl: 4 .  lisinopril-hydrochlorothiazide (PRINZIDE,ZESTORETIC) 20-25 MG tablet, Take 2 tablets by mouth daily., Disp: 180 tablet, Rfl: 4 .  metFORMIN (GLUCOPHAGE-XR) 500 MG 24 hr tablet, TAKE 2 TABLETS (1,000 MG TOTAL) BY TWICE DAILY., Disp: 360 tablet, Rfl: 4 .  metoprolol succinate (TOPROL-XL) 100 MG 24 hr tablet, TAKE 1 TABLET (100 MG TOTAL) BY MOUTH DAILY., Disp: 90 tablet, Rfl: 4 .  MULTIPLE VITAMIN PO, , Disp: , Rfl:  .  OMEGA-3 FATTY ACIDS PO, Take 1,200 mcg by mouth. , Disp: , Rfl:  .  potassium chloride SA (K-DUR,KLOR-CON) 20 MEQ tablet, Take 1 tablet (20 mEq total) by mouth every morning., Disp: 90 tablet, Rfl: 3 .  simvastatin (ZOCOR) 40 MG tablet, TAKE ONE TABLET BY MOUTH EVERY NIGHT AT BEDTIME, Disp: 90 tablet, Rfl: 3 .  Cyanocobalamin 1500 MCG TBDP, Take 1 tablet by mouth daily., Disp: , Rfl:    Patient Care Team: Birdie Sons, MD as PCP - General (Family Medicine) Royston Cowper, MD (Urology) Bryson Ha, OD (Optometry)    Objective:    Vitals: BP 133/77 (BP Location: Left Arm, Patient Position: Sitting, Cuff Size: Large)   Pulse 60   Temp (!) 97.3 F (36.3 C)  (Temporal)   Resp 16   Ht 5\' 11"  (1.803 m)   Wt 276 lb 12.8 oz (125.6 kg)   BMI 38.61 kg/m    Vitals:   06/01/19 0907  BP: 133/77  Pulse: 60  Resp: 16  Temp: (!) 97.3 F (36.3 C)  TempSrc: Temporal  Weight: 276 lb 12.8 oz (125.6 kg)  Height: 5\' 11"  (1.803 m)  Physical Exam    General Appearance:    Obese male. Alert, cooperative, in no acute distress, appears stated age  Head:    Normocephalic, without obvious abnormality, atraumatic  Eyes:    PERRL, conjunctiva/corneas clear, EOM's intact, fundi    benign, both eyes       Ears:    Normal TM's and external ear canals, both ears  Nose:   Nares normal, septum midline, mucosa normal, no drainage   or sinus tenderness  Throat:   Lips, mucosa, and tongue normal; teeth and gums normal  Neck:   Supple, symmetrical, trachea midline, no adenopathy;       thyroid:  No enlargement/tenderness/nodules; no carotid   bruit or JVD  Back:     Symmetric, no curvature, ROM normal, no CVA tenderness  Lungs:     Clear to auscultation bilaterally, respirations unlabored  Chest wall:    No tenderness or deformity  Heart:    Normal heart rate. Normal rhythm. No murmurs, rubs, or gallops.  S1 and S2 normal  Abdomen:     Soft, non-tender, bowel sounds active all four quadrants,    no masses, no organomegaly  Genitalia:    deferred  Rectal:    deferred  Extremities:   All extremities are intact. No cyanosis or edema  Pulses:   2+ and symmetric all extremities  Skin:   Skin color, texture, turgor normal, no rashes or lesions  Lymph nodes:   Cervical, supraclavicular, and axillary nodes normal  Neurologic:   CNII-XII intact. Normal strength, sensation and reflexes      throughout    Depression Screen PHQ 2/9 Scores 06/01/2019 05/25/2018 05/19/2017 05/16/2016  PHQ - 2 Score 0 0 0 0  PHQ- 9 Score 0 0 1 0       Assessment & Plan:     Routine Health Maintenance and Physical Exam  Exercise Activities and Dietary recommendations Goals    None     Immunization History  Administered Date(s) Administered  . Hepatitis A, Adult 05/25/2018  . Pneumococcal Polysaccharide-23 06/01/2013  . Td 02/14/2012  . Tdap 05/27/2007    Health Maintenance  Topic Date Due  . FOOT EXAM  05/03/1972  . HIV Screening  05/03/1977  . OPHTHALMOLOGY EXAM  04/02/2018  . INFLUENZA VACCINE  04/03/2019  . HEMOGLOBIN A1C  07/21/2019  . TETANUS/TDAP  02/13/2022  . COLONOSCOPY  08/19/2023  . PNEUMOCOCCAL POLYSACCHARIDE VACCINE AGE 70-64 HIGH RISK  Completed  . Hepatitis C Screening  Completed     Discussed health benefits of physical activity, and encouraged him to engage in regular exercise appropriate for his age and condition.    -------------------------------------------------------------------- 1. Annual physical exam  - CBC - EKG 12-Lead  2. Need for hepatitis A vaccination  - Hepatitis A vaccine adult IM  3. Essential hypertension Well controlled.  Continue current medications.    4. Morbid obesity ([place]) Counseled regarding prudent diet and regular exercise.   5. Hyperlipidemia, unspecified hyperlipidemia type He is tolerating simvastatin well with no adverse effects.   - Comprehensive metabolic panel - Lipid panel - CBC - EKG 12-Lead  6. Prostate cancer screening  - PSA    Lelon Huh, MD  [facility]

## 2019-06-01 NOTE — Patient Instructions (Signed)
.   Please review the attached list of medications and notify my office if there are any errors.   . Please bring all of your medications to every appointment so we can make sure that our medication list is the same as yours.   . It is especially important to get the annual flu vaccine this year. If you haven't had it already, please go to your pharmacy or call the office as soon as possible to schedule you flu shot.   You need to engage in moderate exercise such as brisk walking for an average of 30 minutes per day. Reduce your calory intake by eating smaller meals and not getting second helpings. Avoid high glycemic index foods with a goal of losing 4-5 pounds per month to achieve and maintain a weight below 240 within the next 6 months.

## 2019-06-02 LAB — CBC
Hematocrit: 48.4 % (ref 37.5–51.0)
Hemoglobin: 15.7 g/dL (ref 13.0–17.7)
MCH: 28.5 pg (ref 26.6–33.0)
MCHC: 32.4 g/dL (ref 31.5–35.7)
MCV: 88 fL (ref 79–97)
Platelets: 183 10*3/uL (ref 150–450)
RBC: 5.51 x10E6/uL (ref 4.14–5.80)
RDW: 12.7 % (ref 11.6–15.4)
WBC: 10.9 10*3/uL — ABNORMAL HIGH (ref 3.4–10.8)

## 2019-06-02 LAB — COMPREHENSIVE METABOLIC PANEL
ALT: 19 IU/L (ref 0–44)
AST: 18 IU/L (ref 0–40)
Albumin/Globulin Ratio: 1.7 (ref 1.2–2.2)
Albumin: 4 g/dL (ref 3.8–4.9)
Alkaline Phosphatase: 87 IU/L (ref 39–117)
BUN/Creatinine Ratio: 14 (ref 9–20)
BUN: 12 mg/dL (ref 6–24)
Bilirubin Total: 0.3 mg/dL (ref 0.0–1.2)
CO2: 24 mmol/L (ref 20–29)
Calcium: 9.6 mg/dL (ref 8.7–10.2)
Chloride: 102 mmol/L (ref 96–106)
Creatinine, Ser: 0.84 mg/dL (ref 0.76–1.27)
GFR calc Af Amer: 112 mL/min/{1.73_m2} (ref >59)
GFR calc non Af Amer: 97 mL/min/{1.73_m2} (ref >59)
Globulin, Total: 2.4 g/dL (ref 1.5–4.5)
Glucose: 161 mg/dL — ABNORMAL HIGH (ref 65–99)
Potassium: 3.9 mmol/L (ref 3.5–5.2)
Sodium: 141 mmol/L (ref 134–144)
Total Protein: 6.4 g/dL (ref 6.0–8.5)

## 2019-06-02 LAB — PSA: Prostate Specific Ag, Serum: 1 ng/mL (ref 0.0–4.0)

## 2019-06-02 LAB — LIPID PANEL
Chol/HDL Ratio: 3.6 ratio (ref 0.0–5.0)
Cholesterol, Total: 160 mg/dL (ref 100–199)
HDL: 44 mg/dL (ref >39)
LDL Chol Calc (NIH): 93 mg/dL (ref 0–99)
Triglycerides: 132 mg/dL (ref 0–149)
VLDL Cholesterol Cal: 23 mg/dL (ref 5–40)

## 2019-06-06 ENCOUNTER — Encounter: Payer: Self-pay | Admitting: Family Medicine

## 2019-06-15 ENCOUNTER — Encounter: Payer: Self-pay | Admitting: Family Medicine

## 2019-10-01 ENCOUNTER — Other Ambulatory Visit: Payer: Self-pay | Admitting: Family Medicine

## 2019-10-01 DIAGNOSIS — E11649 Type 2 diabetes mellitus with hypoglycemia without coma: Secondary | ICD-10-CM

## 2019-10-01 DIAGNOSIS — I10 Essential (primary) hypertension: Secondary | ICD-10-CM

## 2019-10-12 ENCOUNTER — Other Ambulatory Visit: Payer: Self-pay | Admitting: Family Medicine

## 2019-10-12 MED ORDER — JARDIANCE 25 MG PO TABS: 25.0000 mg | ORAL_TABLET | Freq: Every day | ORAL | 4 refills | Status: DC

## 2019-10-12 NOTE — Telephone Encounter (Signed)
[facility] faxed refill request for the following medications:  empagliflozin (JARDIANCE) 25 MG TABS tablet - 90 day supply   Please advise.  Thanks, [employer]

## 2019-10-18 ENCOUNTER — Other Ambulatory Visit: Payer: Self-pay | Admitting: Family Medicine

## 2019-10-18 DIAGNOSIS — I10 Essential (primary) hypertension: Secondary | ICD-10-CM

## 2020-01-17 ENCOUNTER — Other Ambulatory Visit: Payer: Self-pay | Admitting: Family Medicine

## 2020-04-16 ENCOUNTER — Other Ambulatory Visit: Payer: Self-pay | Admitting: Family Medicine

## 2020-04-16 NOTE — Telephone Encounter (Signed)
Requested Prescriptions  Pending Prescriptions Disp Refills  . simvastatin (ZOCOR) 40 MG tablet [Pharmacy Med Name: SIMVASTATIN 40 MG TABLET] 90 tablet 0    Sig: TAKE ONE TABLET BY MOUTH EVERY NIGHT AT BEDTIME     Cardiovascular:  Antilipid - Statins Failed - 04/16/2020  6:51 AM      Failed - LDL in normal range and within 360 days    LDL Cholesterol (Calc)  Date Value Ref Range Status  05/19/2017 76 mg/dL (calc) Final    Comment:    Reference range: <100 . Desirable range <100 mg/dL for primary prevention;   <70 mg/dL for patients with CHD or diabetic patients  with > or = 2 CHD risk factors. Marland Kitchen LDL-C is now calculated using the Martin-Hopkins  calculation, which is a validated novel method providing  better accuracy than the Friedewald equation in the  estimation of LDL-C.  Cresenciano Genre et al. Annamaria Helling. 3244;010(27): 2061-2068  (http://education.[facility].com/faq/FAQ164)    LDL Chol Calc (NIH)  Date Value Ref Range Status  06/01/2019 93 0 - 99 mg/dL Final         Passed - Total Cholesterol in normal range and within 360 days    Cholesterol, Total  Date Value Ref Range Status  06/01/2019 160 100 - 199 mg/dL Final         Passed - HDL in normal range and within 360 days    HDL  Date Value Ref Range Status  06/01/2019 44 >39 mg/dL Final         Passed - Triglycerides in normal range and within 360 days    Triglycerides  Date Value Ref Range Status  06/01/2019 132 0 - 149 mg/dL Final         Passed - Patient is not pregnant      Passed - Valid encounter within last 12 months    Recent Outpatient Visits          10 months ago Annual physical exam   [facility] Birdie Sons, MD   1 year ago Controlled type 2 diabetes mellitus without complication, without long-term current use of insulin ([place])   [facility] Birdie Sons, MD   1 year ago Uncontrolled type 2 diabetes mellitus with hypoglycemia without coma [facility])   [facility] Birdie Sons, MD   1 year ago Annual physical exam   [facility] Birdie Sons, MD   2 years ago Uncontrolled type 2 diabetes mellitus with complication, without long-term current use of insulin [facility])   [facility] Birdie Sons, MD             Reminder included with refill for patient to schedule an appt for an office visit.

## 2020-05-30 LAB — HM DIABETES EYE EXAM

## 2020-05-31 ENCOUNTER — Encounter: Payer: Self-pay | Admitting: Family Medicine

## 2020-05-31 DIAGNOSIS — H3500 Unspecified background retinopathy: Secondary | ICD-10-CM | POA: Insufficient documentation

## 2020-06-29 LAB — LIPID PANEL
Cholesterol: 158 (ref 0–200)
HDL: 41 (ref 35–70)
LDL Cholesterol: 95
Triglycerides: 114 (ref 40–160)

## 2020-06-29 LAB — BASIC METABOLIC PANEL: Glucose: 213

## 2020-06-29 LAB — HEMOGLOBIN A1C: Hemoglobin A1C: 9

## 2020-07-03 ENCOUNTER — Telehealth: Payer: Self-pay

## 2020-07-03 DIAGNOSIS — I10 Essential (primary) hypertension: Secondary | ICD-10-CM

## 2020-07-03 DIAGNOSIS — E11319 Type 2 diabetes mellitus with unspecified diabetic retinopathy without macular edema: Secondary | ICD-10-CM

## 2020-07-03 DIAGNOSIS — E785 Hyperlipidemia, unspecified: Secondary | ICD-10-CM

## 2020-07-03 DIAGNOSIS — Z125 Encounter for screening for malignant neoplasm of prostate: Secondary | ICD-10-CM

## 2020-07-03 NOTE — Telephone Encounter (Signed)
Copied from [place] [phone]. Topic: General - Other >> Jul 03, 2020  4:22 PM Celene Kras wrote: Reason for CRM: Pt calling and is requesting to have lab orders placed for tomorrow morning before his appt at 3pm. Please advise.

## 2020-07-04 ENCOUNTER — Encounter: Payer: Self-pay | Admitting: Family Medicine

## 2020-07-04 ENCOUNTER — Other Ambulatory Visit: Payer: Self-pay

## 2020-07-04 ENCOUNTER — Ambulatory Visit (INDEPENDENT_AMBULATORY_CARE_PROVIDER_SITE_OTHER): Payer: PRIVATE HEALTH INSURANCE | Admitting: Family Medicine

## 2020-07-04 VITALS — BP 135/75 | HR 64 | Temp 98.2°F | Resp 16 | Ht 71.0 in | Wt 272.0 lb

## 2020-07-04 DIAGNOSIS — E11319 Type 2 diabetes mellitus with unspecified diabetic retinopathy without macular edema: Secondary | ICD-10-CM

## 2020-07-04 DIAGNOSIS — Z0000 Encounter for general adult medical examination without abnormal findings: Secondary | ICD-10-CM | POA: Diagnosis not present

## 2020-07-04 DIAGNOSIS — E785 Hyperlipidemia, unspecified: Secondary | ICD-10-CM

## 2020-07-04 DIAGNOSIS — Z125 Encounter for screening for malignant neoplasm of prostate: Secondary | ICD-10-CM

## 2020-07-04 DIAGNOSIS — Z86010 Personal history of colonic polyps: Secondary | ICD-10-CM

## 2020-07-04 DIAGNOSIS — H3500 Unspecified background retinopathy: Secondary | ICD-10-CM

## 2020-07-04 DIAGNOSIS — Z23 Encounter for immunization: Secondary | ICD-10-CM

## 2020-07-04 NOTE — Patient Instructions (Addendum)
.   Please review the attached list of medications and notify my office if there are any errors.   . Please go to the lab draw station in [address] on the second floor of [facility]  when you are fasting for 8 hours about a week before your next appointment. Normal hours are 8:00am to 11:30am and 1:00pm to 4:00pm Monday through Friday

## 2020-07-04 NOTE — Addendum Note (Signed)
Addended by: Lelon Huh E on: 07/04/2020 10:07 AM   Modules accepted: Orders

## 2020-07-04 NOTE — Telephone Encounter (Signed)
Have place future order. Needs to be fasting

## 2020-07-04 NOTE — Telephone Encounter (Signed)
Please advise. Thanks.  

## 2020-07-04 NOTE — Progress Notes (Signed)
Complete physical exam   Patient: Daniel Alvarado   DOB: [birth year]   59 y.o. Male  MRN: [id number] Visit Date: 07/04/2020  Today's healthcare provider: Lelon Huh, MD   Chief Complaint  Patient presents with  . Hyperlipidemia  . Hypertension  . Obesity   Subjective    Daniel Alvarado is a 59 y.o. male who presents today for a complete physical exam.  He reports consuming a general diet. The patient has a physically strenuous job, but has no regular exercise apart from work.  He generally feels fairly well. He reports sleeping fairly well. He does not have additional problems to discuss today.  HPI  Hypertension, follow-up  BP Readings from Last 3 Encounters:  07/04/20 135/75  06/01/19 133/77  01/18/19 122/74   Wt Readings from Last 3 Encounters:  07/04/20 272 lb (123.4 kg)  06/01/19 276 lb 12.8 oz (125.6 kg)  01/18/19 274 lb (124.3 kg)     He was last seen for hypertension 1 year ago.  BP at that visit was 133/77. Management since that visit includes continue same medication.  He reports good compliance with treatment. He is not having side effects.  He is following a Regular diet. He is not exercising. He does not smoke.  Use of agents associated with hypertension: NSAIDS.   Outside blood pressures are not checked. Symptoms: No chest pain No chest pressure  No palpitations No syncope  No dyspnea No orthopnea  No paroxysmal nocturnal dyspnea No lower extremity edema   Pertinent labs: Lab Results  Component Value Date   CHOL 160 06/01/2019   HDL 44 06/01/2019   LDLCALC 93 06/01/2019   TRIG 132 06/01/2019   CHOLHDL 3.6 06/01/2019   Lab Results  Component Value Date   NA 141 06/01/2019   K 3.9 06/01/2019   CREATININE 0.84 06/01/2019   GFRNONAA 97 06/01/2019   GFRAA 112 06/01/2019   GLUCOSE 161 (H) 06/01/2019     The 10-year ASCVD risk score Mikey Bussing DC Jr., et al., 2013) is: 15.3%    ---------------------------------------------------------------------------------------------------  Lipid/Cholesterol, Follow-up  Last lipid panel Other pertinent labs  Lab Results  Component Value Date   CHOL 160 06/01/2019   HDL 44 06/01/2019   LDLCALC 93 06/01/2019   TRIG 132 06/01/2019   CHOLHDL 3.6 06/01/2019   Lab Results  Component Value Date   ALT 19 06/01/2019   AST 18 06/01/2019   PLT 183 06/01/2019   TSH 1.810 05/21/2016     He was last seen for this 1 year ago.  Management since that visit includes continuing same medication.  He reports good compliance with treatment. He is not having side effects.   Symptoms: No chest pain No chest pressure/discomfort  No dyspnea No lower extremity edema  No numbness or tingling of extremity No orthopnea  No palpitations No paroxysmal nocturnal dyspnea  No speech difficulty No syncope   Current diet: in general, an "unhealthy" diet Current exercise: none  The 10-year ASCVD risk score Mikey Bussing DC Jr., et al., 2013) is: 15.3%  ---------------------------------------------------------------------------------------------------  Follow up for obesity:  The patient was last seen for this 1 year ago. Management during the last visit includes counseled regarding prudent diet and regular exercise.  He reports fair compliance with treatment. He feels that condition is Unchanged. He is not having side effects.   -----------------------------------------------------------------------------------------  Diabetes Mellitus Type II, Follow-up  Lab Results  Component Value Date   HGBA1C 9.0 06/29/2020   HGBA1C  7.7 (A) 01/18/2019   HGBA1C 8.6 (A) 09/28/2018   Wt Readings from Last 3 Encounters:  07/04/20 272 lb (123.4 kg)  06/01/19 276 lb 12.8 oz (125.6 kg)  01/18/19 274 lb (124.3 kg)   Last seen for diabetes 1 year ago.  Management since then includes continue same medications. Patient was advised to consider starting  back on B12 at a lower dose due to effect of metformin on B12 levels.  He reports fair compliance with treatment. He has [facility] done last week with A1c of 9.0%. He is taking medications consistently, but admits to having poor diet habits particularly drinking sweet tea and not exercising.  He is not having side effects.  Symptoms: No fatigue No foot ulcerations  No appetite changes No nausea  No paresthesia of the feet  No polydipsia  No polyuria No visual disturbances   No vomiting     Home blood sugar records: blood sugars are not checked  Episodes of hypoglycemia? No    Current insulin regiment: none Most Recent Eye Exam: 07/03/2020 Current exercise: none Current diet habits: in general, an "unhealthy" diet  Pertinent Labs: Lab Results  Component Value Date   CHOL 158 06/29/2020   HDL 41 06/29/2020   LDLCALC 95 06/29/2020   TRIG 114 06/29/2020   CHOLHDL 3.6 06/01/2019   Lab Results  Component Value Date   NA 141 06/01/2019   K 3.9 06/01/2019   CREATININE 0.84 06/01/2019   GFRNONAA 97 06/01/2019   GFRAA 112 06/01/2019   GLUCOSE 161 (H) 06/01/2019     ---------------------------------------------------------------------------------------------------   Past Medical History:  Diagnosis Date  . Decreased libido   . Diabetes mellitus without complication ([place])   . Hypertension   . Hypoxia   . Morbid obesity ([place])    Past Surgical History:  Procedure Laterality Date  . [place]  . COLONOSCOPY WITH PROPOFOL N/A 08/18/2018   Procedure: COLONOSCOPY WITH PROPOFOL;  Surgeon: Jonathon Bellows, MD;  Location: [facility] ENDOSCOPY;  Service: Gastroenterology;  Laterality: N/A;  . WISDOM TOOTH EXTRACTION     Social History   Socioeconomic History  . Marital status: Legally Separated    Spouse name: Not on file  . Number of children: 4  . Years of education: Not on file  . 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 Smoking status: Never Smoker  . Smokeless tobacco: Current User    Types: Snuff  . Tobacco comment: uses dip  Substance and Sexual Activity  . Alcohol use: Yes    Alcohol/week: 0.0 standard drinks    Comment: casual drinker  . Drug use: No  . Sexual activity: Not on file  Other Topics Concern  . Not on file  Social History Narrative  . Not on file   Social Determinants of Health   Financial Resource Strain:   . Difficulty of Paying Living Expenses: Not on file  Food Insecurity:   . Worried About Charity fundraiser in the Last Year: Not on file  . Ran Out of Food in the Last Year: Not on file  Transportation Needs:   . Lack of Transportation (Medical): Not on file  . Lack of Transportation (Non-Medical): Not on file  Physical Activity:   . Days of Exercise per Week: Not on file  . Minutes of Exercise per Session: Not on file  Stress:   .  Feeling of Stress : Not on file  Social Connections:   . Frequency of Communication with Friends and Family: Not on file  . Frequency of Social Gatherings with Friends and Family: Not on file  . Attends Religious Services: Not on file  . Active Member of Clubs or Organizations: Not on file  . Attends Archivist Meetings: Not on file  . Marital Status: Not on file  Intimate Partner Violence:   . Fear of Current or Ex-Partner: Not on file  . Emotionally Abused: Not on file  . Physically Abused: Not on file  . Sexually Abused: Not on file   Family Status  Relation Name Status  . Mother  Alive       broken neck for 20 yrs  . Father  Deceased at age 7       multiple sclerosis   Family History  Problem Relation Age of Onset  . Diabetes Mother    Allergies  Allergen Reactions  . Aspirin Other (See Comments)    Hyper    Patient Care Team: Birdie Sons, MD as PCP - General (Family Medicine) Royston Cowper, MD (Urology) Bryson Ha, OD  (Optometry)   Medications: Outpatient Medications Prior to Visit  Medication Sig  . aspirin 81 MG tablet   . empagliflozin (JARDIANCE) 25 MG TABS tablet Take 25 mg by mouth daily.  . felodipine (PLENDIL) 10 MG 24 hr tablet TAKE ONE TABLET BY MOUTH DAILY FOR BLOOD PRESSURE  . KLOR-CON M20 20 MEQ tablet TAKE ONE TABLET BY MOUTH EVERY MORNING  . lisinopril-hydrochlorothiazide (ZESTORETIC) 20-25 MG tablet TAKE TWO TABLETS BY MOUTH DAILY  . metFORMIN (GLUCOPHAGE-XR) 500 MG 24 hr tablet TAKE TWO TABLETS BY MOUTH TWICE A DAY  . metoprolol succinate (TOPROL-XL) 100 MG 24 hr tablet TAKE ONE TABLET BY MOUTH DAILY  . MULTIPLE VITAMIN PO   . OMEGA-3 FATTY ACIDS PO Take 1,200 mcg by mouth.   . simvastatin (ZOCOR) 40 MG tablet TAKE ONE TABLET BY MOUTH EVERY NIGHT AT BEDTIME  . [DISCONTINUED] Cyanocobalamin 1500 MCG TBDP Take 1 tablet by mouth daily. (Patient not taking: Reported on 07/04/2020)   No facility-administered medications prior to visit.       Objective    BP 135/75   Pulse 64   Temp 98.2 F (36.8 C) (Oral)   Resp 16   Ht 5\' 11"  (1.803 m)   Wt 272 lb (123.4 kg)   BMI 37.94 kg/m    Physical Exam   General Appearance:    Obese male. Alert, cooperative, in no acute distress, appears stated age  Head:    Normocephalic, without obvious abnormality, atraumatic  Eyes:    PERRL, conjunctiva/corneas clear, EOM's intact, fundi    benign, both eyes       Ears:    Normal TM's and external ear canals, both ears  Neck:   Supple, symmetrical, trachea midline, no adenopathy;       thyroid:  No enlargement/tenderness/nodules; no carotid   bruit or JVD  Back:     Symmetric, no curvature, ROM normal, no CVA tenderness  Lungs:     Clear to auscultation bilaterally, respirations unlabored  Chest wall:    No tenderness or deformity  Heart:    Normal heart rate. Normal rhythm. No murmurs, rubs, or gallops.  S1 and S2 normal  Abdomen:     Soft, non-tender, bowel sounds active all four quadrants,     no masses, no organomegaly  Genitalia:  deferred  Rectal:    deferred  Extremities:   All extremities are intact. No cyanosis or edema  Pulses:   2+ and symmetric all extremities  Skin:   Skin color, texture, turgor normal, no rashes or lesions  Lymph nodes:   Cervical, supraclavicular, and axillary nodes normal  Neurologic:   CNII-XII intact. Normal strength, sensation and reflexes      throughout     Last depression screening scores PHQ 2/9 Scores 07/04/2020 06/01/2019 05/25/2018  PHQ - 2 Score 0 0 0  PHQ- 9 Score 0 0 0   Last fall risk screening Fall Risk  06/01/2019  Falls in the past year? 0  Number falls in past yr: 0  Injury with Fall? 0   Last Audit-C alcohol use screening Alcohol Use Disorder Test (AUDIT) 07/04/2020  1. How often do you have a drink containing alcohol? 1  2. How many drinks containing alcohol do you have on a typical day when you are drinking? 0  3. How often do you have six or more drinks on one occasion? 0  AUDIT-C Score 1  Alcohol Brief Interventions/Follow-up AUDIT Score <7 follow-up not indicated   A score of 3 or more in women, and 4 or more in men indicates increased risk for alcohol abuse, EXCEPT if all of the points are from question 1   No results found for any visits on 07/04/20.  Assessment & Plan    Routine Health Maintenance and Physical Exam  Exercise Activities and Dietary recommendations Goals   None     Immunization History  Administered Date(s) Administered  . Hepatitis A, Adult 05/25/2018, 06/01/2019  . Pneumococcal Polysaccharide-23 06/01/2013  . Td 02/14/2012  . Tdap 05/27/2007  . Zoster Recombinat (Shingrix) 07/04/2020    Health Maintenance  Topic Date Due  . FOOT EXAM  Never done  . COVID-19 Vaccine (1) Never done  . HIV Screening  Never done  . HEMOGLOBIN A1C  07/21/2019  . INFLUENZA VACCINE  11/30/2020 (Originally 04/02/2020)  . OPHTHALMOLOGY EXAM  07/04/2021  . TETANUS/TDAP  02/13/2022  . COLONOSCOPY   08/19/2023  . PNEUMOCOCCAL POLYSACCHARIDE VACCINE AGE 81-64 HIGH RISK  Completed  . Hepatitis C Screening  Completed    Discussed health benefits of physical activity, and encouraged him to engage in regular exercise appropriate for his age and condition.  1. Annual physical exam   2. Prostate cancer screening  - PSA Total (Reflex To Free) ([facility] only); Future  3. Need for shingles vaccine  - Zoster, Recombinant (Shingrix); Future   4. Type 2 diabetes mellitus with retinopathy, without long-term current use of insulin, macular edema presence unspecified, unspecified laterality, unspecified retinopathy severity ([place]) Uncontrolled especially due to poor diet habits. He has cut sweet tea out of diet again. Will return in about 2 months. He is to have labs drawn about a week before his follow up appt.  - Hemoglobin A1c; Future  5. Hyperlipidemia, unspecified hyperlipidemia type He is tolerating simvastatin well with no adverse effects.   - CBC; Future - Lipid panel; Future - Comprehensive metabolic panel; Future To have labs drawn about the week before his follow up appt in about two months.  6. Morbid obesity ([place]) He is going to get more strict with tea, especially eliminating sweet tea, and start exercising.   7. History of adenomatous polyp of colon UTD on colonoscopies.   8. Retinopathy of right eye Continue periodic retinal exams.   Recommended flu vaccine which he declined.  The entirety of the information documented in the History of Present Illness, Review of Systems and Physical Exam were personally obtained by me. Portions of this information were initially documented by the CMA and reviewed by me for thoroughness and accuracy.      Lelon Huh, MD  [facility] [phone] (phone) [phone] (fax)  [place]

## 2020-07-16 ENCOUNTER — Other Ambulatory Visit: Payer: Self-pay | Admitting: Family Medicine

## 2020-07-16 DIAGNOSIS — I10 Essential (primary) hypertension: Secondary | ICD-10-CM

## 2020-07-16 NOTE — Telephone Encounter (Signed)
Requested medications are due for refill today?  Yes  Requested medications are on active medication list? Yes  Last Refill:   10/18/2019  # 90 with 2 refills  Future visit scheduled?  Yes  Notes to Clinic:  Medication failed Rx refill protocol due to no labs within the past 360 days.  Last labs were performed on 06/01/2019.

## 2020-07-16 NOTE — Telephone Encounter (Signed)
Requested Prescriptions  Pending Prescriptions Disp Refills  . simvastatin (ZOCOR) 40 MG tablet [Pharmacy Med Name: SIMVASTATIN 40 MG TABLET] 90 tablet 0    Sig: TAKE ONE TABLET BY MOUTH EVERY NIGHT AT BEDTIME     Cardiovascular:  Antilipid - Statins Passed - 07/16/2020  6:51 AM      Passed - Total Cholesterol in normal range and within 360 days    Cholesterol, Total  Date Value Ref Range Status  06/01/2019 160 100 - 199 mg/dL Final   Cholesterol  Date Value Ref Range Status  06/29/2020 158 0 - 200 Final         Passed - LDL in normal range and within 360 days    LDL Cholesterol (Calc)  Date Value Ref Range Status  05/19/2017 76 mg/dL (calc) Final    Comment:    Reference range: <100 . Desirable range <100 mg/dL for primary prevention;   <70 mg/dL for patients with CHD or diabetic patients  with > or = 2 CHD risk factors. Marland Kitchen LDL-C is now calculated using the Martin-Hopkins  calculation, which is a validated novel method providing  better accuracy than the Friedewald equation in the  estimation of LDL-C.  Cresenciano Genre et al. Annamaria Helling. 9924;268(34): 2061-2068  (http://education.[facility].com/faq/FAQ164)    LDL Chol Calc (NIH)  Date Value Ref Range Status  06/01/2019 93 0 - 99 mg/dL Final   LDL Cholesterol  Date Value Ref Range Status  06/29/2020 95  Final         Passed - HDL in normal range and within 360 days    HDL  Date Value Ref Range Status  06/29/2020 41 35 - 70 Final  06/01/2019 44 >39 mg/dL Final         Passed - Triglycerides in normal range and within 360 days    Triglycerides  Date Value Ref Range Status  06/29/2020 114 40 - 160 Final         Passed - Patient is not pregnant      Passed - Valid encounter within last 12 months    Recent Outpatient Visits          1 week ago Annual physical exam   [facility] Birdie Sons, MD   1 year ago Annual physical exam   [facility] Birdie Sons, MD   1 year ago  Controlled type 2 diabetes mellitus without complication, without long-term current use of insulin [facility])   [facility] Birdie Sons, MD   1 year ago Uncontrolled type 2 diabetes mellitus with hypoglycemia without coma [facility])   [facility] Birdie Sons, MD   2 years ago Annual physical exam   [facility] Birdie Sons, MD      Future Appointments            In 1 month Fisher, Kirstie Peri, MD [facility], [place]           . KLOR-CON M20 20 MEQ tablet [Pharmacy Med Name: KLOR-CON M20 TABLET] 90 tablet 2    Sig: TAKE ONE TABLET BY MOUTH EVERY MORNING     Endocrinology:  Minerals - Potassium Supplementation Failed - 07/16/2020  6:51 AM      Failed - K in normal range and within 360 days    Potassium  Date Value Ref Range Status  06/01/2019 3.9 3.5 - 5.2 mmol/L Final         Failed - Cr in normal  range and within 360 days    Creat  Date Value Ref Range Status  05/19/2017 0.77 0.70 - 1.33 mg/dL Final    Comment:    For patients >82 years of age, the reference limit for Creatinine is approximately 13% higher for people identified as African-American. .    Creatinine, Ser  Date Value Ref Range Status  06/01/2019 0.84 0.76 - 1.27 mg/dL Final   Creatinine, POC  Date Value Ref Range Status  05/16/2016 n/a mg/dL Final         Passed - Valid encounter within last 12 months    Recent Outpatient Visits          1 week ago Annual physical exam   [facility] Birdie Sons, MD   1 year ago Annual physical exam   [facility] Birdie Sons, MD   1 year ago Controlled type 2 diabetes mellitus without complication, without long-term current use of insulin [facility])   [facility] Birdie Sons, MD   1 year ago Uncontrolled type 2 diabetes mellitus with hypoglycemia without coma [facility])   [facility] Birdie Sons, MD   2 years ago Annual physical exam    [facility] Birdie Sons, MD      Future Appointments            In 1 month Fisher, Kirstie Peri, MD [facility], [place]

## 2020-09-12 ENCOUNTER — Encounter: Payer: Self-pay | Admitting: Family Medicine

## 2020-09-12 ENCOUNTER — Ambulatory Visit (INDEPENDENT_AMBULATORY_CARE_PROVIDER_SITE_OTHER): Payer: PRIVATE HEALTH INSURANCE | Admitting: Family Medicine

## 2020-09-12 ENCOUNTER — Other Ambulatory Visit: Payer: Self-pay

## 2020-09-12 VITALS — BP 134/77 | HR 57 | Temp 97.2°F | Resp 16 | Wt 271.0 lb

## 2020-09-12 DIAGNOSIS — E11319 Type 2 diabetes mellitus with unspecified diabetic retinopathy without macular edema: Secondary | ICD-10-CM

## 2020-09-12 DIAGNOSIS — Z125 Encounter for screening for malignant neoplasm of prostate: Secondary | ICD-10-CM | POA: Diagnosis not present

## 2020-09-12 DIAGNOSIS — E785 Hyperlipidemia, unspecified: Secondary | ICD-10-CM | POA: Diagnosis not present

## 2020-09-12 DIAGNOSIS — Z23 Encounter for immunization: Secondary | ICD-10-CM

## 2020-09-12 NOTE — Progress Notes (Signed)
Established patient visit   Patient: Daniel Alvarado   DOB: 28-Dec-1960   60 y.o. Male  MRN: [id number] Visit Date: 09/12/2020  Today's healthcare provider: Lelon Huh, MD   Chief Complaint  Patient presents with  . Diabetes  . Hyperlipidemia   Subjective    HPI  Diabetes Mellitus Type II, Follow-up  Lab Results  Component Value Date   HGBA1C 9.0 06/29/2020   HGBA1C 7.7 (A) 01/18/2019   HGBA1C 8.6 (A) 09/28/2018   Wt Readings from Last 3 Encounters:  09/12/20 271 lb (122.9 kg)  07/04/20 272 lb (123.4 kg)  06/01/19 276 lb 12.8 oz (125.6 kg)   Last seen for diabetes 2 months ago.  Management since then includes continue same medications. He reports good compliance with treatment. He is not having side effects.  Symptoms: No fatigue No foot ulcerations  No appetite changes No nausea  No paresthesia of the feet  No polydipsia  No polyuria No visual disturbances   No vomiting     Home blood sugar records: blood sugars are not checked  Episodes of hypoglycemia? No    Current insulin regiment: none Most Recent Eye Exam: 05/30/2020 Current exercise: none Current diet habits: well balanced  Pertinent Labs: Lab Results  Component Value Date   CHOL 158 06/29/2020   HDL 41 06/29/2020   LDLCALC 95 06/29/2020   TRIG 114 06/29/2020   CHOLHDL 3.6 06/01/2019   Lab Results  Component Value Date   NA 141 06/01/2019   K 3.9 06/01/2019   CREATININE 0.84 06/01/2019   GFRNONAA 97 06/01/2019   GFRAA 112 06/01/2019   GLUCOSE 161 (H) 06/01/2019     ---------------------------------------------------------------------------------------------------  Lipid/Cholesterol, Follow-up  Last lipid panel Other pertinent labs  Lab Results  Component Value Date   CHOL 158 06/29/2020   HDL 41 06/29/2020   LDLCALC 95 06/29/2020   TRIG 114 06/29/2020   CHOLHDL 3.6 06/01/2019   Lab Results  Component Value Date   ALT 19 06/01/2019   AST 18 06/01/2019   PLT 183  06/01/2019   TSH 1.810 05/21/2016     He was last seen for this 2 months ago.  Management since that visit includes continue same medications.  He reports good compliance with treatment. He is not having side effects.   Symptoms: No chest pain No chest pressure/discomfort  No dyspnea No lower extremity edema  No numbness or tingling of extremity No orthopnea  No palpitations No paroxysmal nocturnal dyspnea  No speech difficulty No syncope   Current diet: well balanced Current exercise: none  The 10-year ASCVD risk score Mikey Bussing DC Jr., et al., 2013) is: 15.7%* (Cholesterol units were assumed)  ---------------------------------------------------------------------------------------------------     Medications: Outpatient Medications Prior to Visit  Medication Sig  . aspirin 81 MG tablet   . empagliflozin (JARDIANCE) 25 MG TABS tablet Take 25 mg by mouth daily.  . felodipine (PLENDIL) 10 MG 24 hr tablet TAKE ONE TABLET BY MOUTH DAILY FOR BLOOD PRESSURE  . KLOR-CON M20 20 MEQ tablet TAKE ONE TABLET BY MOUTH EVERY MORNING  . lisinopril-hydrochlorothiazide (ZESTORETIC) 20-25 MG tablet TAKE TWO TABLETS BY MOUTH DAILY  . metFORMIN (GLUCOPHAGE-XR) 500 MG 24 hr tablet TAKE TWO TABLETS BY MOUTH TWICE A DAY  . metoprolol succinate (TOPROL-XL) 100 MG 24 hr tablet TAKE ONE TABLET BY MOUTH DAILY  . MULTIPLE VITAMIN PO   . OMEGA-3 FATTY ACIDS PO Take 1,200 mcg by mouth.   . simvastatin (ZOCOR) 40 MG  tablet TAKE ONE TABLET BY MOUTH EVERY NIGHT AT BEDTIME   No facility-administered medications prior to visit.    Review of Systems  Constitutional: Negative for appetite change, chills and fever.  Respiratory: Negative for chest tightness, shortness of breath and wheezing.   Cardiovascular: Negative for chest pain and palpitations.  Gastrointestinal: Negative for abdominal pain, nausea and vomiting.      Objective    BP 134/77 (BP Location: Left Arm, Patient Position: Sitting, Cuff  Size: Large)   Pulse (!) 57   Temp (!) 97.2 F (36.2 C) (Temporal)   Resp 16   Wt 271 lb (122.9 kg)   BMI 37.80 kg/m    Physical Exam   General: Appearance:    Obese male in no acute distress  Eyes:    PERRL, conjunctiva/corneas clear, EOM's intact       Lungs:     Clear to auscultation bilaterally, respirations unlabored  Heart:    Bradycardic. Normal rhythm. No murmurs, rubs, or gallops.   MS:   All extremities are intact.   Neurologic:   Awake, alert, oriented x 3. No apparent focal neurological           defect.        No results found for any visits on 09/12/20.  Assessment & Plan     1. Type 2 diabetes mellitus with retinopathy, without long-term current use of insulin, macular edema presence unspecified, unspecified laterality, unspecified retinopathy severity ([place]) Counseled on additional medications including injectable GLP-1 agonist which may also help lose weight. He is amenable to trial if a1c is still not at goal  - Hemoglobin A1c  2. Hyperlipidemia, unspecified hyperlipidemia type He is tolerating simvastatin well with no adverse effects.   - CBC - Lipid panel - Comprehensive metabolic panel  3. Need for shingles vaccine  - Varicella-zoster vaccine IM  4. Prostate cancer screening  - PSA Total (Reflex To Free) ([facility] only)         The entirety of the information documented in the History of Present Illness, Review of Systems and Physical Exam were personally obtained by me. Portions of this information were initially documented by the CMA and reviewed by me for thoroughness and accuracy.      Lelon Huh, MD  [facility] [phone] (phone) [phone] (fax)  [place]

## 2020-09-13 ENCOUNTER — Ambulatory Visit: Payer: Self-pay | Admitting: *Deleted

## 2020-09-13 LAB — CBC
Hematocrit: 50.7 % (ref 37.5–51.0)
Hemoglobin: 16.4 g/dL (ref 13.0–17.7)
MCH: 28.3 pg (ref 26.6–33.0)
MCHC: 32.3 g/dL (ref 31.5–35.7)
MCV: 88 fL (ref 79–97)
Platelets: 184 10*3/uL (ref 150–450)
RBC: 5.79 x10E6/uL (ref 4.14–5.80)
RDW: 12.5 % (ref 11.6–15.4)
WBC: 10.2 10*3/uL (ref 3.4–10.8)

## 2020-09-13 LAB — COMPREHENSIVE METABOLIC PANEL
ALT: 21 IU/L (ref 0–44)
AST: 19 IU/L (ref 0–40)
Albumin/Globulin Ratio: 1.6 (ref 1.2–2.2)
Albumin: 4.2 g/dL (ref 3.8–4.9)
Alkaline Phosphatase: 83 IU/L (ref 44–121)
BUN/Creatinine Ratio: 15 (ref 9–20)
BUN: 14 mg/dL (ref 6–24)
Bilirubin Total: 0.3 mg/dL (ref 0.0–1.2)
CO2: 25 mmol/L (ref 20–29)
Calcium: 9.7 mg/dL (ref 8.7–10.2)
Chloride: 100 mmol/L (ref 96–106)
Creatinine, Ser: 0.93 mg/dL (ref 0.76–1.27)
GFR calc Af Amer: 104 mL/min/{1.73_m2} (ref >59)
GFR calc non Af Amer: 90 mL/min/{1.73_m2} (ref >59)
Globulin, Total: 2.6 g/dL (ref 1.5–4.5)
Glucose: 170 mg/dL — ABNORMAL HIGH (ref 65–99)
Potassium: 3.7 mmol/L (ref 3.5–5.2)
Sodium: 138 mmol/L (ref 134–144)
Total Protein: 6.8 g/dL (ref 6.0–8.5)

## 2020-09-13 LAB — PSA TOTAL (REFLEX TO FREE): Prostate Specific Ag, Serum: 1 ng/mL (ref 0.0–4.0)

## 2020-09-13 LAB — HEMOGLOBIN A1C
Est. average glucose Bld gHb Est-mCnc: 183 mg/dL
Hgb A1c MFr Bld: 8 % — ABNORMAL HIGH (ref 4.8–5.6)

## 2020-09-13 LAB — LIPID PANEL
Chol/HDL Ratio: 3.9 ratio (ref 0.0–5.0)
Cholesterol, Total: 160 mg/dL (ref 100–199)
HDL: 41 mg/dL (ref >39)
LDL Chol Calc (NIH): 98 mg/dL (ref 0–99)
Triglycerides: 115 mg/dL (ref 0–149)
VLDL Cholesterol Cal: 21 mg/dL (ref 5–40)

## 2020-09-13 NOTE — Telephone Encounter (Signed)
Patient called and left message to call back to review lab results.

## 2020-09-13 NOTE — Telephone Encounter (Signed)
See previous encounter

## 2020-09-25 ENCOUNTER — Other Ambulatory Visit: Payer: Self-pay | Admitting: Family Medicine

## 2020-09-25 DIAGNOSIS — I10 Essential (primary) hypertension: Secondary | ICD-10-CM

## 2020-09-25 DIAGNOSIS — E11649 Type 2 diabetes mellitus with hypoglycemia without coma: Secondary | ICD-10-CM

## 2020-10-06 ENCOUNTER — Other Ambulatory Visit: Payer: Self-pay | Admitting: Family Medicine

## 2020-10-06 DIAGNOSIS — E11649 Type 2 diabetes mellitus with hypoglycemia without coma: Secondary | ICD-10-CM

## 2020-10-06 DIAGNOSIS — I10 Essential (primary) hypertension: Secondary | ICD-10-CM

## 2020-10-14 ENCOUNTER — Other Ambulatory Visit: Payer: Self-pay | Admitting: Family Medicine

## 2020-10-14 DIAGNOSIS — I10 Essential (primary) hypertension: Secondary | ICD-10-CM

## 2020-12-07 ENCOUNTER — Other Ambulatory Visit: Payer: Self-pay | Admitting: Family Medicine

## 2020-12-07 NOTE — Telephone Encounter (Signed)
Requested medication (s) are due for refill today: Yes  Requested medication (s) are on the active medication list: Yes  Last refill:  10/12/19  Future visit scheduled: Yes  Notes to clinic:  Prescription has expired.    Requested Prescriptions  Pending Prescriptions Disp Refills   JARDIANCE 25 MG TABS tablet [Pharmacy Med Name: JARDIANCE 25 MG TABLET] 60 tablet     Sig: TAKE ONE TABLET BY MOUTH DAILY      Endocrinology:  Diabetes - SGLT2 Inhibitors Failed - 12/07/2020  8:53 AM      Failed - HBA1C is between 0 and 7.9 and within 180 days    Hemoglobin A1C  Date Value Ref Range Status  06/29/2020 9.0  Final   Hgb A1c MFr Bld  Date Value Ref Range Status  09/12/2020 8.0 (H) 4.8 - 5.6 % Final    Comment:             Prediabetes: 5.7 - 6.4          Diabetes: >6.4          Glycemic control for adults with diabetes: <7.0           Passed - Cr in normal range and within 360 days    Creat  Date Value Ref Range Status  05/19/2017 0.77 0.70 - 1.33 mg/dL Final    Comment:    For patients >56 years of age, the reference limit for Creatinine is approximately 13% higher for people identified as African-American. .    Creatinine, Ser  Date Value Ref Range Status  09/12/2020 0.93 0.76 - 1.27 mg/dL Final   Creatinine, POC  Date Value Ref Range Status  05/16/2016 n/a mg/dL Final          Passed - LDL in normal range and within 360 days    LDL Cholesterol (Calc)  Date Value Ref Range Status  05/19/2017 76 mg/dL (calc) Final    Comment:    Reference range: <100 . Desirable range <100 mg/dL for primary prevention;   <70 mg/dL for patients with CHD or diabetic patients  with > or = 2 CHD risk factors. Marland Kitchen LDL-C is now calculated using the Martin-Hopkins  calculation, which is a validated novel method providing  better accuracy than the Friedewald equation in the  estimation of LDL-C.  Cresenciano Genre et al. Annamaria Helling. 1941;740(81): 2061-2068   (http://education.[employer].com/faq/FAQ164)    LDL Chol Calc (NIH)  Date Value Ref Range Status  09/12/2020 98 0 - 99 mg/dL Final          Passed - eGFR in normal range and within 360 days    GFR, Est African American  Date Value Ref Range Status  05/19/2017 118 > OR = 60 mL/min/1.33m Final   GFR calc Af Amer  Date Value Ref Range Status  09/12/2020 104 >59 mL/min/1.73 Final    Comment:    **In accordance with recommendations from the NKF-ASN Task force,**   [facility] is in the process of updating its eGFR calculation to the   2021 CKD-EPI creatinine equation that estimates kidney function   without a race variable.    GFR, Est Non African American  Date Value Ref Range Status  05/19/2017 102 > OR = 60 mL/min/1.724mFinal   GFR calc non Af Amer  Date Value Ref Range Status  09/12/2020 90 >59 mL/min/1.73 Final          Passed - Valid encounter within last 6 months    Recent Outpatient Visits  2 months ago Type 2 diabetes mellitus with retinopathy, without long-term current use of insulin, macular edema presence unspecified, unspecified laterality, unspecified retinopathy severity [facility])   [facility] Birdie Sons, MD   5 months ago Annual physical exam   [facility] Birdie Sons, MD   1 year ago Annual physical exam   [facility] Birdie Sons, MD   1 year ago Controlled type 2 diabetes mellitus without complication, without long-term current use of insulin [facility])   [facility] Birdie Sons, MD   2 years ago Uncontrolled type 2 diabetes mellitus with hypoglycemia without coma [facility])   [facility] Birdie Sons, MD       Future Appointments             In 3 weeks Fisher, Kirstie Peri, MD [facility], [place]

## 2021-01-02 ENCOUNTER — Ambulatory Visit: Payer: Self-pay | Admitting: Family Medicine

## 2021-01-03 ENCOUNTER — Other Ambulatory Visit: Payer: Self-pay | Admitting: Family Medicine

## 2021-01-03 DIAGNOSIS — E11319 Type 2 diabetes mellitus with unspecified diabetic retinopathy without macular edema: Secondary | ICD-10-CM

## 2021-01-09 ENCOUNTER — Ambulatory Visit: Payer: PRIVATE HEALTH INSURANCE | Admitting: Family Medicine

## 2021-01-09 ENCOUNTER — Encounter: Payer: Self-pay | Admitting: Family Medicine

## 2021-01-09 ENCOUNTER — Other Ambulatory Visit: Payer: Self-pay

## 2021-01-09 VITALS — BP 138/70 | HR 52 | Temp 97.9°F | Resp 18 | Wt 269.0 lb

## 2021-01-09 DIAGNOSIS — E11319 Type 2 diabetes mellitus with unspecified diabetic retinopathy without macular edema: Secondary | ICD-10-CM

## 2021-01-09 NOTE — Progress Notes (Signed)
Established patient visit   Patient: Daniel Alvarado   DOB: [birth year]   60 y.o. Male  MRN: [id number] Visit Date: 01/09/2021  Today's healthcare provider: Lelon Huh, MD   Chief Complaint  Patient presents with  . Diabetes  . Hypertension   Subjective    HPI  Diabetes Mellitus Type II, Follow-up  Lab Results  Component Value Date   HGBA1C 8.0 (H) 09/12/2020   HGBA1C 9.0 06/29/2020   HGBA1C 7.7 (A) 01/18/2019   Wt Readings from Last 3 Encounters:  01/09/21 269 lb (122 kg)  09/12/20 271 lb (122.9 kg)  07/04/20 272 lb (123.4 kg)   Last seen for diabetes 3 months ago.  Management since then includes counseling patient to work on [employer] and increasing exercise. He reports good compliance with treatment. He is not having side effects.  Symptoms: Yes fatigue No foot ulcerations  No appetite changes No nausea  No paresthesia of the feet  No polydipsia  No polyuria No visual disturbances   No vomiting     Home blood sugar records: blood sugars are not checked  Episodes of hypoglycemia? No    Current insulin regiment: none Most Recent Eye Exam: 05/30/2020 Current exercise: none Current diet habits: on average, 2-3  meals per day Patient has cut back on portion sizes.   Pertinent Labs: Lab Results  Component Value Date   CHOL 160 09/12/2020   HDL 41 09/12/2020   LDLCALC 98 09/12/2020   TRIG 115 09/12/2020   CHOLHDL 3.9 09/12/2020   Lab Results  Component Value Date   NA 138 09/12/2020   K 3.7 09/12/2020   CREATININE 0.93 09/12/2020   GFRNONAA 90 09/12/2020   GFRAA 104 09/12/2020   GLUCOSE 170 (H) 09/12/2020     ---------------------------------------------------------------------------------------------------  Hypertension, follow-up  BP Readings from Last 3 Encounters:  01/09/21 (!) 147/75  09/12/20 134/77  07/04/20 135/75   Wt Readings from Last 3 Encounters:  01/09/21 269 lb (122 kg)  09/12/20 271 lb (122.9 kg)  07/04/20 272  lb (123.4 kg)     He was last seen for hypertension 3 months ago.  BP at that visit was 134/77. Management since that visit includes continuing the same medication.  He reports good compliance with treatment. He is not having side effects.  He is following a Regular diet. He is not exercising. He does not smoke.  Use of agents associated with hypertension: NSAIDS.   Outside blood pressures are not checked. Symptoms: No chest pain No chest pressure  No palpitations No syncope  No dyspnea No orthopnea  No paroxysmal nocturnal dyspnea No lower extremity edema   Pertinent labs: Lab Results  Component Value Date   CHOL 160 09/12/2020   HDL 41 09/12/2020   LDLCALC 98 09/12/2020   TRIG 115 09/12/2020   CHOLHDL 3.9 09/12/2020   Lab Results  Component Value Date   NA 138 09/12/2020   K 3.7 09/12/2020   CREATININE 0.93 09/12/2020   GFRNONAA 90 09/12/2020   GFRAA 104 09/12/2020   GLUCOSE 170 (H) 09/12/2020     The 10-year ASCVD risk score Mikey Bussing DC Jr., et al., 2013) is: 18.5%   ---------------------------------------------------------------------------------------------------     Medications: Outpatient Medications Prior to Visit  Medication Sig  . aspirin 81 MG tablet   . felodipine (PLENDIL) 10 MG 24 hr tablet TAKE ONE TABLET BY MOUTH DAILY FOR BLOOD PRESSURE  . JARDIANCE 25 MG TABS tablet TAKE ONE TABLET BY MOUTH DAILY  .  KLOR-CON M20 20 MEQ tablet TAKE ONE TABLET BY MOUTH EVERY MORNING  . lisinopril-hydrochlorothiazide (ZESTORETIC) 20-25 MG tablet TAKE TWO TABLETS BY MOUTH DAILY  . metFORMIN (GLUCOPHAGE-XR) 500 MG 24 hr tablet TAKE TWO TABLETS BY MOUTH TWICE A DAY  . metoprolol succinate (TOPROL-XL) 100 MG 24 hr tablet TAKE ONE TABLET BY MOUTH DAILY  . MULTIPLE VITAMIN PO   . OMEGA-3 FATTY ACIDS PO Take 1,200 mcg by mouth.   . simvastatin (ZOCOR) 40 MG tablet TAKE ONE TABLET BY MOUTH EVERY NIGHT AT BEDTIME   No facility-administered medications prior to visit.     Review of Systems  Constitutional: Negative for appetite change, chills and fever.  Respiratory: Negative for chest tightness, shortness of breath and wheezing.   Cardiovascular: Negative for chest pain and palpitations.  Gastrointestinal: Negative for abdominal pain, nausea and vomiting.       Objective    BP 138/70   Pulse (!) 52   Temp 97.9 F (36.6 C) (Temporal)   Resp 18   Wt 269 lb (122 kg)   BMI 37.52 kg/m     Physical Exam  General appearance: Mildly obese male, cooperative and in no acute distress Head: Normocephalic, without obvious abnormality, atraumatic Respiratory: Respirations even and unlabored, normal respiratory rate Extremities: All extremities are intact.  Skin: Skin color, texture, turgor normal. No rashes seen  Psych: Appropriate mood and affect. Neurologic: Mental status: Alert, oriented to person, place, and time, thought content appropriate.     Assessment & Plan     1. Type 2 diabetes mellitus with retinopathy, without long-term current use of insulin, macular edema presence unspecified, unspecified laterality, unspecified retinopathy severity ([place]) Doing well with current medications. A1c was drawn this morning and is pending. He willing to try injectable GLP1 agonist if a1c is still 8 or higher.         The entirety of the information documented in the History of Present Illness, Review of Systems and Physical Exam were personally obtained by me. Portions of this information were initially documented by the CMA and reviewed by me for thoroughness and accuracy.      Lelon Huh, MD  [facility] [phone] (phone) [phone] (fax)  [place]

## 2021-01-10 LAB — HEMOGLOBIN A1C
Est. average glucose Bld gHb Est-mCnc: 212 mg/dL
Hgb A1c MFr Bld: 9 % — ABNORMAL HIGH (ref 4.8–5.6)

## 2021-01-12 ENCOUNTER — Other Ambulatory Visit: Payer: Self-pay | Admitting: Family Medicine

## 2021-01-12 DIAGNOSIS — I10 Essential (primary) hypertension: Secondary | ICD-10-CM

## 2021-01-12 NOTE — Telephone Encounter (Signed)
Requested medications are due for refill today yes  Requested medications are on the active medication list yes  Last refill 2/13  Last visit 01/09/21, last lab 09/2020  Future visit scheduled no  Notes to clinic Unsure if this was to be continuous, last lab 09/2020.

## 2021-01-26 ENCOUNTER — Telehealth: Payer: Self-pay | Admitting: Family Medicine

## 2021-01-26 MED ORDER — TRULICITY 0.75 MG/0.5ML ~~LOC~~ SOAJ: 0.7500 mg | SUBCUTANEOUS | 0 refills | Status: DC

## 2021-01-26 NOTE — Telephone Encounter (Signed)
Patient states he is out of samples of trulicity and was advised to call PCP when samples were out. Patient requesting a prescription be sent to the pharmacy today    [place], [place] Phone:  [phone]  Fax:  [phone]     Also any issues or concerns patient would like a follow up call

## 2021-01-26 NOTE — Telephone Encounter (Signed)
Please review. Not sure which dose of trulicity the patient is using. Not on med list. Thanks!

## 2021-02-08 ENCOUNTER — Other Ambulatory Visit: Payer: Self-pay | Admitting: Family Medicine

## 2021-03-23 ENCOUNTER — Other Ambulatory Visit: Payer: Self-pay | Admitting: Family Medicine

## 2021-03-23 DIAGNOSIS — E11649 Type 2 diabetes mellitus with hypoglycemia without coma: Secondary | ICD-10-CM

## 2021-03-23 DIAGNOSIS — I10 Essential (primary) hypertension: Secondary | ICD-10-CM

## 2021-04-19 ENCOUNTER — Other Ambulatory Visit: Payer: Self-pay | Admitting: Family Medicine

## 2021-05-17 DIAGNOSIS — E113293 Type 2 diabetes mellitus with mild nonproliferative diabetic retinopathy without macular edema, bilateral: Secondary | ICD-10-CM | POA: Diagnosis not present

## 2021-05-17 DIAGNOSIS — E083293 Diabetes mellitus due to underlying condition with mild nonproliferative diabetic retinopathy without macular edema, bilateral: Secondary | ICD-10-CM | POA: Diagnosis not present

## 2021-05-17 DIAGNOSIS — H524 Presbyopia: Secondary | ICD-10-CM | POA: Diagnosis not present

## 2021-05-17 LAB — HM DIABETES EYE EXAM

## 2021-06-18 ENCOUNTER — Telehealth: Payer: Self-pay | Admitting: Family Medicine

## 2021-06-18 MED ORDER — TRULICITY 1.5 MG/0.5ML ~~LOC~~ SOAJ: 1.5000 mg | SUBCUTANEOUS | 0 refills | Status: DC

## 2021-06-18 NOTE — Telephone Encounter (Signed)
[place] faxed refill request for the following medications:   TRULICITY 1.5 NO/6.7EH SOPN   Last Rx: 04/19/21 LOV: 01/09/21 with Dr. Caryn Section Please advise. Thanks TNP

## 2021-07-14 ENCOUNTER — Other Ambulatory Visit: Payer: Self-pay | Admitting: Family Medicine

## 2021-07-14 NOTE — Telephone Encounter (Signed)
Requested Prescriptions  Pending Prescriptions Disp Refills  . simvastatin (ZOCOR) 40 MG tablet [Pharmacy Med Name: SIMVASTATIN 40 MG TABLET] 90 tablet 0    Sig: TAKE ONE TABLET BY MOUTH EVERY NIGHT AT BEDTIME     Cardiovascular:  Antilipid - Statins Passed - 07/14/2021  6:51 AM      Passed - Total Cholesterol in normal range and within 360 days    Cholesterol, Total  Date Value Ref Range Status  09/12/2020 160 100 - 199 mg/dL Final         Passed - LDL in normal range and within 360 days    LDL Cholesterol (Calc)  Date Value Ref Range Status  05/19/2017 76 mg/dL (calc) Final    Comment:    Reference range: <100 . Desirable range <100 mg/dL for primary prevention;   <70 mg/dL for patients with CHD or diabetic patients  with > or = 2 CHD risk factors. Marland Kitchen LDL-C is now calculated using the Martin-Hopkins  calculation, which is a validated novel method providing  better accuracy than the Friedewald equation in the  estimation of LDL-C.  Cresenciano Genre et al. Annamaria Helling. 1950;932(67): 2061-2068  (http://education.[facility].com/faq/FAQ164)    LDL Chol Calc (NIH)  Date Value Ref Range Status  09/12/2020 98 0 - 99 mg/dL Final         Passed - HDL in normal range and within 360 days    HDL  Date Value Ref Range Status  09/12/2020 41 >39 mg/dL Final         Passed - Triglycerides in normal range and within 360 days    Triglycerides  Date Value Ref Range Status  09/12/2020 115 0 - 149 mg/dL Final         Passed - Patient is not pregnant      Passed - Valid encounter within last 12 months    Recent Outpatient Visits          6 months ago Type 2 diabetes mellitus with retinopathy, without long-term current use of insulin, macular edema presence unspecified, unspecified laterality, unspecified retinopathy severity ([place])   [facility] Birdie Sons, MD   10 months ago Type 2 diabetes mellitus with retinopathy, without long-term current use of insulin, macular  edema presence unspecified, unspecified laterality, unspecified retinopathy severity [facility])   [facility] Birdie Sons, MD   1 year ago Annual physical exam   [facility] Birdie Sons, MD   2 years ago Annual physical exam   [facility] Birdie Sons, MD   2 years ago Controlled type 2 diabetes mellitus without complication, without long-term current use of insulin [facility])   [facility]      Future Appointments            In 3 months Fisher, Kirstie Peri, MD [facility], [place]

## 2021-07-22 ENCOUNTER — Telehealth: Payer: Self-pay | Admitting: Family Medicine

## 2021-07-22 NOTE — Telephone Encounter (Signed)
Requested medication (s) are due for refill today: yes  Requested medication (s) are on the active medication list: yes  Last refill:  06/18/21 2 ml   Future visit scheduled: yes  Notes to clinic:  overdue lab work   Requested Prescriptions  Pending Prescriptions Disp Refills   TRULICITY 1.5 ER/1.5QM SOPN [Pharmacy Med Name: TRULICITY 1.5 GQ/6.7 ML PEN] 2 mL 0    Sig: INJECT 1.5 MG UNDER THE SKIN ONCE WEEKLY     Endocrinology:  Diabetes - GLP-1 Receptor Agonists Failed - 07/22/2021  6:51 AM      Failed - HBA1C is between 0 and 7.9 and within 180 days    Hemoglobin A1C  Date Value Ref Range Status  06/29/2020 9.0  Final   Hgb A1c MFr Bld  Date Value Ref Range Status  01/09/2021 9.0 (H) 4.8 - 5.6 % Final    Comment:             Prediabetes: 5.7 - 6.4          Diabetes: >6.4          Glycemic control for adults with diabetes: <7.0           Failed - Valid encounter within last 6 months    Recent Outpatient Visits           6 months ago Type 2 diabetes mellitus with retinopathy, without long-term current use of insulin, macular edema presence unspecified, unspecified laterality, unspecified retinopathy severity ([place])   [facility] Birdie Sons, MD   10 months ago Type 2 diabetes mellitus with retinopathy, without long-term current use of insulin, macular edema presence unspecified, unspecified laterality, unspecified retinopathy severity ([place])   [place], Donald E, MD   1 year ago Annual physical exam   [facility] Birdie Sons, MD   2 years ago Annual physical exam   [facility] Birdie Sons, MD   2 years ago Controlled type 2 diabetes mellitus without complication, without long-term current use of insulin [facility])   [facility] Birdie Sons, MD       Future Appointments             In 3 months Fisher, Kirstie Peri, MD [facility], [place]

## 2021-07-22 NOTE — Telephone Encounter (Signed)
This patient has uncontrolled diabetes and has not been seen since May. He needs to schedule office visit within the next month.

## 2021-07-23 NOTE — Telephone Encounter (Signed)
Tried calling patient. I was unable to reach patient on his phone. I tried calling his wife. I left message to call back. OK for PEC to advise that patient is over due for follow up office visit. OK for [facility] to schedule appointment.

## 2021-08-07 DIAGNOSIS — E1165 Type 2 diabetes mellitus with hyperglycemia: Secondary | ICD-10-CM | POA: Diagnosis not present

## 2021-09-22 ENCOUNTER — Other Ambulatory Visit: Payer: Self-pay | Admitting: Family Medicine

## 2021-09-22 DIAGNOSIS — I10 Essential (primary) hypertension: Secondary | ICD-10-CM

## 2021-09-22 DIAGNOSIS — E11649 Type 2 diabetes mellitus with hypoglycemia without coma: Secondary | ICD-10-CM

## 2021-09-22 NOTE — Telephone Encounter (Signed)
Requested medication (s) are due for refill today: yes  Requested medication (s) are on the active medication list: yes  Last refill:  all refill same day 03/23/21  Future visit scheduled: yes  Notes to clinic:  overdue appt and labs   Requested Prescriptions  Pending Prescriptions Disp Refills   lisinopril-hydrochlorothiazide (ZESTORETIC) 20-25 MG tablet [Pharmacy Med Name: LISINOPRIL-HCTZ 20-25 MG TAB] 180 tablet 1    Sig: TAKE TWO TABLETS BY MOUTH DAILY     Cardiovascular:  ACEI + Diuretic Combos Failed - 09/22/2021  6:51 AM      Failed - Na in normal range and within 180 days    Sodium  Date Value Ref Range Status  09/12/2020 138 134 - 144 mmol/L Final          Failed - K in normal range and within 180 days    Potassium  Date Value Ref Range Status  09/12/2020 3.7 3.5 - 5.2 mmol/L Final          Failed - Cr in normal range and within 180 days    Creat  Date Value Ref Range Status  05/19/2017 0.77 0.70 - 1.33 mg/dL Final    Comment:    For patients >24 years of age, the reference limit for Creatinine is approximately 13% higher for people identified as African-American. .    Creatinine, Ser  Date Value Ref Range Status  09/12/2020 0.93 0.76 - 1.27 mg/dL Final   Creatinine, POC  Date Value Ref Range Status  05/16/2016 n/a mg/dL Final          Failed - Ca in normal range and within 180 days    Calcium  Date Value Ref Range Status  09/12/2020 9.7 8.7 - 10.2 mg/dL Final          Failed - Valid encounter within last 6 months    Recent Outpatient Visits           8 months ago Type 2 diabetes mellitus with retinopathy, without long-term current use of insulin, macular edema presence unspecified, unspecified laterality, unspecified retinopathy severity ([place])   [facility] Birdie Sons, MD   1 year ago Type 2 diabetes mellitus with retinopathy, without long-term current use of insulin, macular edema presence unspecified, unspecified  laterality, unspecified retinopathy severity [facility])   [facility] Birdie Sons, MD   1 year ago Annual physical exam   [facility] Birdie Sons, MD   2 years ago Annual physical exam   [facility] Birdie Sons, MD   2 years ago Controlled type 2 diabetes mellitus without complication, without long-term current use of insulin [facility])   [facility] Birdie Sons, MD       Future Appointments             In 1 month Fisher, Kirstie Peri, MD [facility], [place] - Patient is not pregnant      Passed - Last BP in normal range    BP Readings from Last 1 Encounters:  01/09/21 138/70           metFORMIN (GLUCOPHAGE-XR) 500 MG 24 hr tablet [Pharmacy Med Name: METFORMIN HCL XR 500 MG TABLET] 360 tablet 1    Sig: TAKE TWO TABLETS BY MOUTH TWICE A DAY     Endocrinology:  Diabetes - Biguanides Failed - 09/22/2021  6:51 AM  Failed - Cr in normal range and within 360 days    Creat  Date Value Ref Range Status  05/19/2017 0.77 0.70 - 1.33 mg/dL Final    Comment:    For patients >87 years of age, the reference limit for Creatinine is approximately 13% higher for people identified as African-American. .    Creatinine, Ser  Date Value Ref Range Status  09/12/2020 0.93 0.76 - 1.27 mg/dL Final   Creatinine, POC  Date Value Ref Range Status  05/16/2016 n/a mg/dL Final          Failed - HBA1C is between 0 and 7.9 and within 180 days    Hemoglobin A1C  Date Value Ref Range Status  06/29/2020 9.0  Final   Hgb A1c MFr Bld  Date Value Ref Range Status  01/09/2021 9.0 (H) 4.8 - 5.6 % Final    Comment:             Prediabetes: 5.7 - 6.4          Diabetes: >6.4          Glycemic control for adults with diabetes: <7.0           Failed - eGFR in normal range and within 360 days    GFR, Est African American  Date Value Ref Range Status  05/19/2017 118 > OR = 60 mL/min/1.56m  Final   GFR calc Af Amer  Date Value Ref Range Status  09/12/2020 104 >59 mL/min/1.73 Final    Comment:    **In accordance with recommendations from the NKF-ASN Task force,**   [facility] is in the process of updating its eGFR calculation to the   2021 CKD-EPI creatinine equation that estimates kidney function   without a race variable.    GFR, Est Non African American  Date Value Ref Range Status  05/19/2017 102 > OR = 60 mL/min/1.730mFinal   GFR calc non Af Amer  Date Value Ref Range Status  09/12/2020 90 >59 mL/min/1.73 Final          Failed - Valid encounter within last 6 months    Recent Outpatient Visits           8 months ago Type 2 diabetes mellitus with retinopathy, without long-term current use of insulin, macular edema presence unspecified, unspecified laterality, unspecified retinopathy severity ([place]  [facility]   1 year ago Type 2 diabetes mellitus with retinopathy, without long-term current use of insulin, macular edema presence unspecified, unspecified laterality, unspecified retinopathy severity ([place]  [facility]   1 year ago Annual physical exam   [facility]   2 years ago Annual physical exam   [facility]   2 years ago Controlled type 2 diabetes mellitus without complication, without long-term current use of insulin ([facility]  [place]       Future Appointments             In 1 month Fisher, DoKirstie PeriMD [facility]             felodipine (PLENDIL) 10 MG 24 hr tablet [Pharmacy Med Name: FELODIPINE ER 10 MG TABLET] 90 tablet 1    Sig: TAKE ONE TABLET BY MOUTH DAILY FOR BLOOD PRESSURE     Cardiovascular:  Calcium Channel Blockers Failed - 09/22/2021  6:51 AM  Failed - Valid encounter within last 6 months    Recent Outpatient Visits            8 months ago Type 2 diabetes mellitus with retinopathy, without long-term current use of insulin, macular edema presence unspecified, unspecified laterality, unspecified retinopathy severity ([place])   [facility] Birdie Sons, MD   1 year ago Type 2 diabetes mellitus with retinopathy, without long-term current use of insulin, macular edema presence unspecified, unspecified laterality, unspecified retinopathy severity [facility])   [facility] Birdie Sons, MD   1 year ago Annual physical exam   [facility] Birdie Sons, MD   2 years ago Annual physical exam   [facility] Birdie Sons, MD   2 years ago Controlled type 2 diabetes mellitus without complication, without long-term current use of insulin ([place])   [facility] Birdie Sons, MD       Future Appointments             In 1 month Fisher, Kirstie Peri, MD [facility], [facility]            Passed - Last BP in normal range    BP Readings from Last 1 Encounters:  01/09/21 138/70           metoprolol succinate (TOPROL-XL) 100 MG 24 hr tablet [Pharmacy Med Name: METOPROLOL SUCC ER 100 MG TAB] 90 tablet 1    Sig: TAKE ONE TABLET BY MOUTH DAILY     Cardiovascular:  Beta Blockers Failed - 09/22/2021  6:51 AM      Failed - Valid encounter within last 6 months    Recent Outpatient Visits           8 months ago Type 2 diabetes mellitus with retinopathy, without long-term current use of insulin, macular edema presence unspecified, unspecified laterality, unspecified retinopathy severity ([place])   [facility] Birdie Sons, MD   1 year ago Type 2 diabetes mellitus with retinopathy, without long-term current use of insulin, macular edema presence unspecified, unspecified laterality, unspecified retinopathy severity ([place])   [facility] Birdie Sons, MD   1 year ago Annual physical exam   [facility] Birdie Sons, MD   2 years ago Annual physical exam   [facility] Birdie Sons, MD   2 years ago Controlled type 2 diabetes mellitus without complication, without long-term current use of insulin [facility])   [facility] Birdie Sons, MD       Future Appointments             In 1 month Fisher, Kirstie Peri, MD [facility], [facility]            Passed - Last BP in normal range    BP Readings from Last 1 Encounters:  01/09/21 138/70          Passed - Last Heart Rate in normal range    Pulse Readings from Last 1 Encounters:  01/09/21 (!) 52

## 2021-10-13 ENCOUNTER — Encounter: Payer: Self-pay | Admitting: Family Medicine

## 2021-10-14 ENCOUNTER — Other Ambulatory Visit: Payer: Self-pay | Admitting: Family Medicine

## 2021-10-15 ENCOUNTER — Telehealth: Payer: Self-pay | Admitting: Family Medicine

## 2021-10-15 DIAGNOSIS — E785 Hyperlipidemia, unspecified: Secondary | ICD-10-CM

## 2021-10-15 MED ORDER — SIMVASTATIN 40 MG PO TABS: 40.0000 mg | ORAL_TABLET | Freq: Every day | ORAL | 0 refills | Status: DC

## 2021-10-15 NOTE — Telephone Encounter (Signed)
[place] faxed refill request for the following medications:  simvastatin (ZOCOR) 40 MG tablet   Please advise

## 2021-10-30 ENCOUNTER — Ambulatory Visit (INDEPENDENT_AMBULATORY_CARE_PROVIDER_SITE_OTHER): Payer: BC Managed Care – PPO | Admitting: Family Medicine

## 2021-10-30 ENCOUNTER — Encounter: Payer: Self-pay | Admitting: Family Medicine

## 2021-10-30 ENCOUNTER — Other Ambulatory Visit: Payer: Self-pay

## 2021-10-30 VITALS — BP 128/64 | HR 89 | Temp 98.2°F | Resp 16 | Ht 71.0 in | Wt 262.4 lb

## 2021-10-30 DIAGNOSIS — E11319 Type 2 diabetes mellitus with unspecified diabetic retinopathy without macular edema: Secondary | ICD-10-CM

## 2021-10-30 DIAGNOSIS — I10 Essential (primary) hypertension: Secondary | ICD-10-CM | POA: Diagnosis not present

## 2021-10-30 DIAGNOSIS — Z125 Encounter for screening for malignant neoplasm of prostate: Secondary | ICD-10-CM

## 2021-10-30 DIAGNOSIS — E785 Hyperlipidemia, unspecified: Secondary | ICD-10-CM | POA: Diagnosis not present

## 2021-10-30 DIAGNOSIS — Z0000 Encounter for general adult medical examination without abnormal findings: Secondary | ICD-10-CM

## 2021-10-30 NOTE — Patient Instructions (Addendum)
Please review the attached list of medications and notify my office if there are any errors.   Please bring all of your medications to every appointment so we can make sure that our medication list is the same as yours.   It is recommended to engage in 150 minutes of moderate exercise every week.

## 2021-10-30 NOTE — Progress Notes (Addendum)
I,Roshena L Chambers,acting as a scribe for Lelon Huh, MD.,have documented all relevant documentation on the behalf of Lelon Huh, MD,as directed by  Lelon Huh, MD while in the presence of Lelon Huh, MD.    Complete physical exam   Patient: Daniel Alvarado   DOB: [birth year]   61 y.o. Male  MRN: [id number] Visit Date: 10/30/2021  Today's healthcare provider: Lelon Huh, MD   Chief Complaint  Patient presents with   Annual Exam   Diabetes   Hypertension   Hyperlipidemia   Subjective    Daniel Alvarado is a 61 y.o. male who presents today for a complete physical exam.  He reports consuming a general diet. The patient has a physically strenuous job, but has no regular exercise apart from work.  He generally feels well. He reports sleeping fairly well. He does not have additional problems to discuss today.  HPI  Diabetes Mellitus Type II, Follow-up  Lab Results  Component Value Date   HGBA1C 9.0 (H) 01/09/2021   HGBA1C 8.0 (H) 09/12/2020   HGBA1C 9.0 06/29/2020   Wt Readings from Last 3 Encounters:  01/09/21 269 lb (122 kg)  09/12/20 271 lb (122.9 kg)  07/04/20 272 lb (123.4 kg)   Last seen for diabetes 9 months ago.  Management since then includes adding Trulicity. He reports good compliance with treatment. He is not having side effects. none Symptoms: No fatigue No foot ulcerations  No appetite changes No nausea  No paresthesia of the feet  no polydipsia  Yes polyuria No visual disturbances   No vomiting     Home blood sugar records:  not checked Most Recent Eye Exam: 05/17/2021 Current exercise: none Current diet habits: in general, a "healthy" diet    Pertinent Labs: Lab Results  Component Value Date   CHOL 160 09/12/2020   HDL 41 09/12/2020   LDLCALC 98 09/12/2020   TRIG 115 09/12/2020   CHOLHDL 3.9 09/12/2020   Lab Results  Component Value Date   NA 138 09/12/2020   K 3.7 09/12/2020   CREATININE 0.93 09/12/2020   GFRNONAA  90 09/12/2020   MICROALBUR 20 05/16/2016     ---------------------------------------------------------------------------------------------------   Hypertension, follow-up  BP Readings from Last 3 Encounters:  01/09/21 138/70  09/12/20 134/77  07/04/20 135/75   Wt Readings from Last 3 Encounters:  01/09/21 269 lb (122 kg)  09/12/20 271 lb (122.9 kg)  07/04/20 272 lb (123.4 kg)     He was last seen for hypertension 1  year  ago.   Management since that visit includes continue same medication.  He reports good compliance with treatment. He is not having side effects. none He is following a Regular diet. He is not exercising. He does not smoke.   Outside blood pressures are not checked  Symptoms: No chest pain No chest pressure  No palpitations No syncope  No dyspnea No orthopnea  No paroxysmal nocturnal dyspnea No lower extremity edema     ---------------------------------------------------------------------------------------------------   Lipid/Cholesterol, Follow-up  Last lipid panel Other pertinent labs  Lab Results  Component Value Date   CHOL 160 09/12/2020   HDL 41 09/12/2020   LDLCALC 98 09/12/2020   TRIG 115 09/12/2020   CHOLHDL 3.9 09/12/2020   Lab Results  Component Value Date   ALT 21 09/12/2020   AST 19 09/12/2020   PLT 184 09/12/2020   TSH 1.810 05/21/2016     He was last seen for this 1  year  ago.  Management since that visit includes continuing same medication.  He reports good compliance with treatment. He is not having side effects. none  Symptoms: No chest pain No chest pressure/discomfort  No dyspnea No lower extremity edema  No numbness or tingling of extremity No orthopnea  No palpitations No paroxysmal nocturnal dyspnea  No speech difficulty No syncope   Current diet: in general, a "healthy" diet   Current exercise: none  The 10-year ASCVD risk score (Arnett DK, et al., 2019) is:  18.2%  ---------------------------------------------------------------------------------------------------   Past Medical History:  Diagnosis Date   Decreased libido    Diabetes mellitus without complication ([place])    Hypertension    Hypoxia    Morbid obesity ([place])    Past Surgical History:  Procedure Laterality Date   AORTA SURGERY  1978   COLONOSCOPY WITH PROPOFOL N/A 08/18/2018   Procedure: COLONOSCOPY WITH PROPOFOL;  Surgeon: Jonathon Bellows, MD;  Location: [facility] END[facility]COPY;  Service: Gastroenterology;  Laterality: N/A;   WISDOM TOOTH EXTRACTION     Social History   Socioeconomic History   Marital status: Divorced    Spouse name: Not on file   Number of children: 4   Years of education: Not on file   Highest education level: Not on file  Occupational History    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 [place]  Tobacco Use   Smoking status: Never   Smokeless tobacco: Current    Types: Snuff   Tobacco comments:    uses dip  Substance and Sexual Activity   Alcohol use: Yes    Alcohol/week: 0.0 standard drinks    Comment: casual drinker   Drug use: No   Sexual activity: Not on file  Other Topics Concern   Not on file  Social History Narrative   Not on file   Social Determinants of Health   Financial Resource Strain: Not on file  Food Insecurity: Not on file  Transportation Needs: Not on file  Physical Activity: Not on file  Stress: Not on file  Social Connections: Not on file  Intimate Partner Violence: Not on file   Family Status  Relation Name Status   Mother  Alive       broken neck for 79 yrs   Father  Deceased at age 71       multiple sclerosis   Family History  Problem Relation Age of Onset   Diabetes Mother    Allergies  Allergen Reactions   Aspirin Other (See Comments)    Hyper    Patient Care Team: Birdie Sons, MD as PCP - General (Family Medicine) Royston Cowper, MD (Urology) Bryson Ha, OD (Optometry)    Medications: Outpatient Medications Prior to Visit  Medication Sig   aspirin 81 MG tablet    felodipine (PLENDIL) 10 MG 24 hr tablet TAKE ONE TABLET BY MOUTH DAILY FOR BLOOD PRESSURE   JARDIANCE 25 MG TABS tablet TAKE ONE TABLET BY MOUTH DAILY   lisinopril-hydrochlorothiazide (ZESTORETIC) 20-25 MG tablet TAKE TWO TABLETS BY MOUTH DAILY   metFORMIN (GLUCOPHAGE-XR) 500 MG 24 hr tablet TAKE TWO TABLETS BY MOUTH TWICE A DAY   metoprolol succinate (TOPROL-XL) 100 MG 24 hr tablet TAKE ONE TABLET BY MOUTH DAILY   MULTIPLE VITAMIN PO    OMEGA-3 FATTY ACIDS PO Take 1,200 mcg by mouth.    potassium chloride SA (KLOR-CON M20) 20 MEQ tablet Take 1 tablet (20 mEq total) by mouth every morning.   simvastatin (ZOCOR)  40 MG tablet Take 1 tablet (40 mg total) by mouth at bedtime.   TRULICITY 1.5 GU/5.4YH SOPN INJECT 1.5 MG UNDER THE SKIN ONCE WEEKLY   No facility-administered medications prior to visit.    Review of Systems  Constitutional:  Negative for appetite change, chills, fatigue and fever.  HENT:  Negative for congestion, ear pain, hearing loss, nosebleeds and trouble swallowing.   Eyes:  Negative for pain and visual disturbance.  Respiratory:  Negative for cough, chest tightness and shortness of breath.   Cardiovascular:  Negative for chest pain, palpitations and leg swelling.  Gastrointestinal:  Negative for abdominal pain, blood in stool, constipation, diarrhea, nausea and vomiting.  Endocrine: Negative for polydipsia, polyphagia and polyuria.  Genitourinary:  Negative for dysuria and flank pain.  Musculoskeletal:  Negative for arthralgias, back pain, joint swelling, myalgias and neck stiffness.  Skin:  Negative for color change, rash and wound.  Neurological:  Negative for dizziness, tremors, seizures, speech difficulty, weakness, light-headedness and headaches.  Psychiatric/Behavioral:  Negative for behavioral problems, confusion, decreased concentration, dysphoric mood and sleep  disturbance. The patient is not nervous/anxious.   All other systems reviewed and are negative.    Objective    BP 128/64 (BP Location: Right Arm, Patient Position: Sitting, Cuff Size: Normal)    Pulse 89    Temp 98.2 F (36.8 C) (Oral)    Resp 16    Ht 5\' 11"  (1.803 m)    Wt 262 lb 6.4 oz (119 kg)    SpO2 94%    BMI 36.60 kg/m     Physical Exam   General Appearance:    Obese male. Alert, cooperative, in no acute distress, appears stated age  Head:    Normocephalic, without obvious abnormality, atraumatic  Eyes:    PERRL, conjunctiva/corneas clear, EOM's intact, fundi    benign, both eyes       Ears:    Normal TM's and external ear canals, both ears  Neck:   Supple, symmetrical, trachea midline, no adenopathy;       thyroid:  No enlargement/tenderness/nodules; no carotid   bruit or JVD  Back:     Symmetric, no curvature, ROM normal, no CVA tenderness  Lungs:     Clear to auscultation bilaterally, respirations unlabored  Chest wall:    No tenderness or deformity  Heart:    Normal heart rate. Normal rhythm. No murmurs, rubs, or gallops.  S1 and S2 normal  Abdomen:     Soft, non-tender, bowel sounds active all four quadrants,    no masses, no organomegaly  Genitalia:    deferred  Rectal:    deferred  Extremities:   All extremities are intact. No cyanosis or edema  Pulses:   2+ and symmetric all extremities  Skin:   Skin color, texture, turgor normal, no rashes or lesions  Lymph nodes:   Cervical, supraclavicular, and axillary nodes normal  Neurologic:   CNII-XII intact. Normal strength, sensation and reflexes      throughout     Last depression screening scores PHQ 2/9 Scores 09/12/2020 07/04/2020 06/01/2019  PHQ - 2 Score 0 0 0  PHQ- 9 Score 2 0 0   Last fall risk screening Fall Risk  06/01/2019  Falls in the past year? 0  Number falls in past yr: 0  Injury with Fall? 0   Last Audit-C alcohol use screening Alcohol Use Disorder Test (AUDIT) 07/04/2020  1. How often do you  have a drink containing alcohol? 1  2.  How many drinks containing alcohol do you have on a typical day when you are drinking? 0  3. How often do you have six or more drinks on one occasion? 0  AUDIT-C Score 1  Alcohol Brief Interventions/Follow-up AUDIT Score <7 follow-up not indicated   A score of 3 or more in women, and 4 or more in men indicates increased risk for alcohol abuse, EXCEPT if all of the points are from question 1   No results found for any visits on 10/30/21.  Assessment & Plan    Routine Health Maintenance and Physical Exam  Exercise Activities and Dietary recommendations  Goals   None     Immunization History  Administered Date(s) Administered   Hepatitis A, Adult 05/25/2018, 06/01/2019   PFIZER(Purple Top)SARS-COV-2 Vaccination 11/16/2019, 12/07/2019, 09/12/2020   Pneumococcal Polysaccharide-23 06/01/2013   Td 02/14/2012   Tdap 05/27/2007   Zoster Recombinat (Shingrix) 07/04/2020, 09/12/2020    Health Maintenance  Topic Date Due   FOOT EXAM  Never done   HIV Screening  Never done   COVID-19 Vaccine (4 - Booster for Pfizer series) 11/07/2020   INFLUENZA VACCINE  Never done   HEMOGLOBIN A1C  07/12/2021   TETANUS/TDAP  02/13/2022   OPHTHALMOLOGY EXAM  05/17/2022   COLONOSCOPY (Pts 45-77yrs Insurance coverage will need to be confirmed)  08/19/2023   Hepatitis C Screening  Completed   Zoster Vaccines- Shingrix  Completed   HPV VACCINES  Aged Out    Discussed health benefits of physical activity, and encouraged him to engage in regular exercise appropriate for his age and condition.  1. Primary hypertension Well controlled.     2. Hyperlipidemia, unspecified hyperlipidemia type He is tolerating simvastatin well with no adverse effects.   - CBC - Comprehensive metabolic panel - Lipid panel  3. Type 2 diabetes mellitus with retinopathy, without long-term current use of insulin, macular edema presence unspecified, unspecified laterality, unspecified  retinopathy severity ([place]) Tolerating current medications well. He was unable to provide sample for urine microalbumin - Hemoglobin A1c - EKG 12-Lead  4. Prostate cancer screening  - PSA Total (Reflex To Free)  5. Morbid obesity ([place]) Encourage regular exercise and healthy diet.      The entirety of the information documented in the History of Present Illness, Review of Systems and Physical Exam were personally obtained by me. Portions of this information were initially documented by the CMA and reviewed by me for thoroughness and accuracy.     Lelon Huh, MD  [facility] [phone] (phone) [phone] (fax)  [place]

## 2021-11-02 DIAGNOSIS — Z125 Encounter for screening for malignant neoplasm of prostate: Secondary | ICD-10-CM | POA: Diagnosis not present

## 2021-11-02 DIAGNOSIS — E785 Hyperlipidemia, unspecified: Secondary | ICD-10-CM | POA: Diagnosis not present

## 2021-11-02 DIAGNOSIS — E11319 Type 2 diabetes mellitus with unspecified diabetic retinopathy without macular edema: Secondary | ICD-10-CM | POA: Diagnosis not present

## 2021-11-03 LAB — COMPREHENSIVE METABOLIC PANEL
ALT: 24 IU/L (ref 0–44)
AST: 18 IU/L (ref 0–40)
Albumin/Globulin Ratio: 1.6 (ref 1.2–2.2)
Albumin: 3.9 g/dL (ref 3.8–4.9)
Alkaline Phosphatase: 85 IU/L (ref 44–121)
BUN/Creatinine Ratio: 19 (ref 9–20)
BUN: 17 mg/dL (ref 6–24)
Bilirubin Total: 0.4 mg/dL (ref 0.0–1.2)
CO2: 23 mmol/L (ref 20–29)
Calcium: 9.3 mg/dL (ref 8.7–10.2)
Chloride: 101 mmol/L (ref 96–106)
Creatinine, Ser: 0.88 mg/dL (ref 0.76–1.27)
Globulin, Total: 2.4 g/dL (ref 1.5–4.5)
Glucose: 191 mg/dL — ABNORMAL HIGH (ref 70–99)
Potassium: 3.6 mmol/L (ref 3.5–5.2)
Sodium: 142 mmol/L (ref 134–144)
Total Protein: 6.3 g/dL (ref 6.0–8.5)
eGFR: 99 mL/min/{1.73_m2} (ref >59)

## 2021-11-03 LAB — PSA TOTAL (REFLEX TO FREE): Prostate Specific Ag, Serum: 1.1 ng/mL (ref 0.0–4.0)

## 2021-11-03 LAB — CBC
Hematocrit: 47.5 % (ref 37.5–51.0)
Hemoglobin: 15.5 g/dL (ref 13.0–17.7)
MCH: 28.7 pg (ref 26.6–33.0)
MCHC: 32.6 g/dL (ref 31.5–35.7)
MCV: 88 fL (ref 79–97)
Platelets: 194 10*3/uL (ref 150–450)
RBC: 5.41 x10E6/uL (ref 4.14–5.80)
RDW: 12.2 % (ref 11.6–15.4)
WBC: 8.7 10*3/uL (ref 3.4–10.8)

## 2021-11-03 LAB — LIPID PANEL
Chol/HDL Ratio: 4.1 ratio (ref 0.0–5.0)
Cholesterol, Total: 160 mg/dL (ref 100–199)
HDL: 39 mg/dL — ABNORMAL LOW (ref >39)
LDL Chol Calc (NIH): 100 mg/dL — ABNORMAL HIGH (ref 0–99)
Triglycerides: 116 mg/dL (ref 0–149)
VLDL Cholesterol Cal: 21 mg/dL (ref 5–40)

## 2021-11-03 LAB — HEMOGLOBIN A1C
Est. average glucose Bld gHb Est-mCnc: 186 mg/dL
Hgb A1c MFr Bld: 8.1 % — ABNORMAL HIGH (ref 4.8–5.6)

## 2021-11-07 MED ORDER — TRULICITY 3 MG/0.5ML ~~LOC~~ SOAJ: 3.0000 mg | SUBCUTANEOUS | 3 refills | Status: DC

## 2021-11-07 NOTE — Addendum Note (Signed)
Addended by: Birdie Sons on: 11/07/2021 02:10 PM   Modules accepted: Orders

## 2021-12-19 ENCOUNTER — Ambulatory Visit: Payer: Self-pay | Admitting: *Deleted

## 2021-12-19 DIAGNOSIS — E11319 Type 2 diabetes mellitus with unspecified diabetic retinopathy without macular edema: Secondary | ICD-10-CM

## 2021-12-19 NOTE — Telephone Encounter (Signed)
? ? ?  Chief Complaint: Pt. States he has "pain over my kidneys since increasing Trulicity dose."  ?Symptoms: Constant ache ?Frequency: Started after increasing dosage ?Pertinent Negatives: Patient denies  ?Disposition: '[]'$ ED /'[]'$ Urgent Care (no appt availability in office) / '[]'$ Appointment(In office/virtual)/ '[]'$  [facility]/ '[]'$ Home Care/ '[]'$ Refused Recommended Disposition /'[]'$ [facility]/ '[]'$  Follow-up with PCP ?Additional Notes: Please advise pt.  ?Answer Assessment - Initial Assessment Questions ?1. NAME of MEDICATION: "What medicine are you calling about?" ?    Trulicity ?2. QUESTION: "What is your question?" (e.g., double dose of medicine, side effect) ?    Can it cause kidney pain. Has pain over both kidneys now, constant ?3. PRESCRIBING HCP: "Who prescribed it?" Reason: if prescribed by specialist, call should be referred to that group. ?    Dr. Caryn Section ?4. SYMPTOMS: "Do you have any symptoms?" ?    Pain over kidneys ?5. SEVERITY: If symptoms are present, ask "Are they mild, moderate or severe?" ?    Mild-moderate ?6. PREGNANCY:  "Is there any chance that you are pregnant?" "When was your last menstrual period?" ?    N/a ? ?Protocols used: Medication Question Call-A-AH ? ?

## 2021-12-19 NOTE — Telephone Encounter (Signed)
Pt informed and agreeable.  Will call in a wk to update.  ?

## 2021-12-19 NOTE — Telephone Encounter (Signed)
Skip Trulicity for 1 week. If the pain goes away then will restart at the lower dose of 1.'5mg'$   call and let me know after a week.  ?

## 2021-12-19 NOTE — Telephone Encounter (Signed)
Summary: Dulaglutide (TRULICITY) 3 EP/3.2RJ SOPN pain in kidney area  ? Patient called in to inform Dr Caryn Section that he would like to go back to the lower dose of Dulaglutide (TRULICITY) 3 JO/8.4ZY SOPN. Say since he took the first shot of the 3.0 MG/ML he has had a pain in the kidney area and its not gone anywhere and he is concerned and think it is the Dulaglutide (TRULICITY) 3 SA/6.3KZ SOPN that caused it. Please advise  Ph# [phone]   ?  ?No answer, voicemail left. ?

## 2021-12-28 ENCOUNTER — Telehealth: Payer: Self-pay

## 2021-12-28 NOTE — Telephone Encounter (Signed)
Duplicate message. 

## 2021-12-28 NOTE — Telephone Encounter (Signed)
Copied from [place] [phone]. Topic: General - Other ?>> Dec 27, 2021  1:23 PM Yvette Rack wrote: ?Reason for CRM:  ? ?Pt reports that his back and kidney area feels better since he stopped the Dulaglutide (TRULICITY) that had been increased. Pt request call back at [phone] ?  ?

## 2021-12-28 NOTE — Telephone Encounter (Signed)
Copied from [place] [phone]. Topic: General - Other ?>> Dec 27, 2021  1:23 PM Yvette Rack wrote: ?Reason for CRM: Pt reports that his back and kidney area feels better since he stopped the Dulaglutide (TRULICITY) that had been increased. Pt request call back at [phone] ?

## 2022-01-10 MED ORDER — TRULICITY 1.5 MG/0.5ML ~~LOC~~ SOAJ
1.5000 mg | SUBCUTANEOUS | 1 refills | Status: DC
Start: 2022-01-10 — End: 2022-01-25

## 2022-01-10 NOTE — Addendum Note (Signed)
Addended by: Birdie Sons on: 01/10/2022 09:23 PM ? ? Modules accepted: Orders ? ?

## 2022-01-10 NOTE — Telephone Encounter (Signed)
Please advise have sent prescription for 1.'5mg'$  pens to [facility].  ?

## 2022-01-11 ENCOUNTER — Other Ambulatory Visit: Payer: Self-pay | Admitting: Family Medicine

## 2022-01-11 DIAGNOSIS — E785 Hyperlipidemia, unspecified: Secondary | ICD-10-CM

## 2022-01-14 ENCOUNTER — Other Ambulatory Visit: Payer: Self-pay | Admitting: Family Medicine

## 2022-01-14 DIAGNOSIS — E11319 Type 2 diabetes mellitus with unspecified diabetic retinopathy without macular edema: Secondary | ICD-10-CM

## 2022-01-14 NOTE — Telephone Encounter (Signed)
Medication Refill - Medication:  ?Dulaglutide (TRULICITY) 1.5 WN/4.6EV SOPN [[id number]]  ?Has the patient contacted their pharmacy? Yes.  Pharmacy has no record that the medication was received. Can this please be resent ? ? ? ?Preferred Pharmacy (with phone number or street name):  ?[facility] [id number] [place]  ?[place] [address]  ?Phone: [phone] Fax: [phone]  ?Hours: Not open 24 hours  ? ? ? ?Has the patient been seen for an appointment in the last year OR does the patient have an upcoming appointment? Yes.  02/01/22 ? ?Agent: Please be advised that RX refills may take up to 3 business days. We ask that you follow-up with your pharmacy.  ?

## 2022-01-16 NOTE — Telephone Encounter (Signed)
Filled by PCP 01/10/22. ?

## 2022-01-24 NOTE — Telephone Encounter (Signed)
Pt called in, states pharmacy  doesn't have Trulicity 1.5 refill. He states was changed back to 1.5 mg from the 3.0 because of back pains he was having. Please call back wit status of 1.5 refill.  [facility] [id number], [place] Phone:  [phone]  Fax:  [phone]

## 2022-01-25 MED ORDER — TRULICITY 1.5 MG/0.5ML ~~LOC~~ SOAJ: 1.5000 mg | SUBCUTANEOUS | 1 refills | Status: DC

## 2022-01-25 NOTE — Telephone Encounter (Signed)
Requested Prescriptions  Pending Prescriptions Disp Refills  . Dulaglutide (TRULICITY) 1.5 RX/5.4MG SOPN 2 mL 1    Sig: Inject 1.5 mg into the skin once a week.     Endocrinology:  Diabetes - GLP-1 Receptor Agonists Failed - 01/25/2022 11:21 AM      Failed - HBA1C is between 0 and 7.9 and within 180 days    Hemoglobin A1C  Date Value Ref Range Status  06/29/2020 9.0  Final   Hgb A1c MFr Bld  Date Value Ref Range Status  11/02/2021 8.1 (H) 4.8 - 5.6 % Final    Comment:             Prediabetes: 5.7 - 6.4          Diabetes: >6.4          Glycemic control for adults with diabetes: <7.0          Passed - Valid encounter within last 6 months    Recent Outpatient Visits          2 months ago Primary hypertension   [facility] Birdie Sons, MD   1 year ago Type 2 diabetes mellitus with retinopathy, without long-term current use of insulin, macular edema presence unspecified, unspecified laterality, unspecified retinopathy severity ([place])   [facility] Birdie Sons, MD   1 year ago Type 2 diabetes mellitus with retinopathy, without long-term current use of insulin, macular edema presence unspecified, unspecified laterality, unspecified retinopathy severity [facility]    [facility] Birdie Sons, MD   1 year ago Annual physical exam   [facility] Birdie Sons, MD   2 years ago Annual physical exam   [facility] Birdie Sons, MD      Future Appointments            In 1 week Fisher, Kirstie Peri, MD [facility]           Refused Prescriptions Disp Refills  . Dulaglutide (TRULICITY) 1.5 QQ/7.6PP SOPN 2 mL 1    Sig: Inject 1.5 mg into the skin once a week.     Endocrinology:  Diabetes - GLP-1 Receptor Agonists Failed - 01/25/2022 11:21 AM      Failed - HBA1C is between 0 and 7.9 and within 180 days    Hemoglobin A1C  Date Value Ref Range Status  06/29/2020 9.0  Final   Hgb  A1c MFr Bld  Date Value Ref Range Status  11/02/2021 8.1 (H) 4.8 - 5.6 % Final    Comment:             Prediabetes: 5.7 - 6.4          Diabetes: >6.4          Glycemic control for adults with diabetes: <7.0          Passed - Valid encounter within last 6 months    Recent Outpatient Visits          2 months ago Primary hypertension   [facility] Birdie Sons, MD   1 year ago Type 2 diabetes mellitus with retinopathy, without long-term current use of insulin, macular edema presence unspecified, unspecified laterality, unspecified retinopathy severity ([place])   [facility] Birdie Sons, MD   1 year ago Type 2 diabetes mellitus with retinopathy, without long-term current use of insulin, macular edema presence unspecified, unspecified laterality, unspecified retinopathy severity [facility])   [facility] Birdie Sons,  MD   1 year ago Annual physical exam   [facility] Birdie Sons, MD   2 years ago Annual physical exam   [facility] Birdie Sons, MD      Future Appointments            In 1 week Fisher, Kirstie Peri, MD [facility], [place]

## 2022-01-30 NOTE — Progress Notes (Signed)
I,Roshena L Chambers,acting as a scribe for Lelon Huh, MD.,have documented all relevant documentation on the behalf of Lelon Huh, MD,as directed by  Lelon Huh, MD while in the presence of Lelon Huh, MD.   Established patient visit   Patient: Daniel Alvarado   DOB: 06/08/1961   61 y.o. Male  MRN: [id number] Visit Date: 02/01/2022  Today's healthcare provider: Lelon Huh, MD   Chief Complaint  Patient presents with   Diabetes   Subjective    HPI  Diabetes Mellitus Type II, Follow-up  Lab Results  Component Value Date   HGBA1C 8.2 (A) 02/01/2022   HGBA1C 8.1 (H) 11/02/2021   HGBA1C 9.0 (H) 01/09/2021   Wt Readings from Last 3 Encounters:  02/01/22 259 lb (117.5 kg)  10/30/21 262 lb 6.4 oz (119 kg)  01/09/21 269 lb (122 kg)   Last seen for diabetes 3 months ago.  Management since then includes increasing dose of Trulicity to 60m once a week. Dosage was later decreased to 1.567mdue to GI intolerance. He reports good compliance with treatment. He is not having side effects.  Symptoms: No fatigue No foot ulcerations  No appetite changes No nausea  No paresthesia of the feet  No polydipsia  No polyuria No visual disturbances   No vomiting     Home blood sugar records:  blood sugars are not checked  Episodes of hypoglycemia? No    Current insulin regiment: none Most Recent Eye Exam: 05/17/2021 Current exercise: none Current diet habits: well balanced  Pertinent Labs: Lab Results  Component Value Date   CHOL 160 11/02/2021   HDL 39 (L) 11/02/2021   LDLCALC 100 (H) 11/02/2021   TRIG 116 11/02/2021   CHOLHDL 4.1 11/02/2021   Lab Results  Component Value Date   NA 142 11/02/2021   K 3.6 11/02/2021   CREATININE 0.88 11/02/2021   EGFR 99 11/02/2021   MICROALBUR 20 05/16/2016     ---------------------------------------------------------------------------------------------------   Medications: Outpatient Medications Prior to Visit   Medication Sig   aspirin 81 MG tablet    Dulaglutide (TRULICITY) 1.5 MGFT/7.3UKOPN Inject 1.5 mg into the skin once a week.   felodipine (PLENDIL) 10 MG 24 hr tablet TAKE ONE TABLET BY MOUTH DAILY FOR BLOOD PRESSURE   JARDIANCE 25 MG TABS tablet TAKE ONE TABLET BY MOUTH DAILY   lisinopril-hydrochlorothiazide (ZESTORETIC) 20-25 MG tablet TAKE TWO TABLETS BY MOUTH DAILY   metFORMIN (GLUCOPHAGE-XR) 500 MG 24 hr tablet TAKE TWO TABLETS BY MOUTH TWICE A DAY   metoprolol succinate (TOPROL-XL) 100 MG 24 hr tablet TAKE ONE TABLET BY MOUTH DAILY   MULTIPLE VITAMIN PO    OMEGA-3 FATTY ACIDS PO Take 1,200 mcg by mouth.    potassium chloride SA (KLOR-CON M20) 20 MEQ tablet Take 1 tablet (20 mEq total) by mouth every morning.   simvastatin (ZOCOR) 40 MG tablet TAKE ONE TABLET BY MOUTH AT BEDTIME   No facility-administered medications prior to visit.    Review of Systems  Constitutional:  Negative for appetite change, chills and fever.  Respiratory:  Negative for chest tightness, shortness of breath and wheezing.   Cardiovascular:  Negative for chest pain and palpitations.  Gastrointestinal:  Negative for abdominal pain, nausea and vomiting.      Objective    BP 134/66 (BP Location: Left Arm, Patient Position: Sitting, Cuff Size: Large)   Pulse 68   Temp 98.5 F (36.9 C) (Oral)   Resp 14   Wt 259 lb (  117.5 kg)   SpO2 98% Comment: room air  BMI 36.12 kg/m    Physical Exam  General appearance: Mildly obese male, cooperative and in no acute distress Head: Normocephalic, without obvious abnormality, atraumatic Respiratory: Respirations even and unlabored, normal respiratory rate Extremities: All extremities are intact.  Skin: Skin color, texture, turgor normal. No rashes seen  Psych: Appropriate mood and affect. Neurologic: Mental status: Alert, oriented to person, place, and time, thought content appropriate.   Results for orders placed or performed in visit on 02/01/22  POCT HgB A1C   Result Value Ref Range   Hemoglobin A1C 8.2 (A) 4.0 - 5.6 %   Est. average glucose Bld gHb Est-mCnc 189     Assessment & Plan     1. Type 2 diabetes mellitus with retinopathy, without long-term current use of insulin, macular edema presence unspecified, unspecified laterality, unspecified retinopathy severity ([place]) Intolerant to 37m Trulicity prescribed at last visit. A1c is up likely due to missing 3 weeks of medications. He started back on 1.5 dose this week and is tolerating much better. .Marland KitchenHe is working on reducing the amount of sugar in his sweet tea. Will follow up in 10-12 weeks. Consider adding pioglitazone or [facility].  - Urine microalbumin-creatinine with uACR  2. Primary hypertension Well controlled. Continue current medications.        The entirety of the information documented in the History of Present Illness, Review of Systems and Physical Exam were personally obtained by me. Portions of this information were initially documented by the CMA and reviewed by me for thoroughness and accuracy.     DLelon Huh MD  [phone](phone) [phone](fax)  [place]

## 2022-02-01 ENCOUNTER — Other Ambulatory Visit: Payer: Self-pay | Admitting: Family Medicine

## 2022-02-01 ENCOUNTER — Encounter: Payer: Self-pay | Admitting: Family Medicine

## 2022-02-01 ENCOUNTER — Ambulatory Visit: Payer: BC Managed Care – PPO | Admitting: Family Medicine

## 2022-02-01 VITALS — BP 134/66 | HR 68 | Temp 98.5°F | Resp 14 | Wt 259.0 lb

## 2022-02-01 DIAGNOSIS — I10 Essential (primary) hypertension: Secondary | ICD-10-CM

## 2022-02-01 DIAGNOSIS — E11319 Type 2 diabetes mellitus with unspecified diabetic retinopathy without macular edema: Secondary | ICD-10-CM

## 2022-02-01 LAB — POCT GLYCOSYLATED HEMOGLOBIN (HGB A1C)
Est. average glucose Bld gHb Est-mCnc: 189
Hemoglobin A1C: 8.2 % — AB (ref 4.0–5.6)

## 2022-02-03 LAB — MICROALBUMIN / CREATININE URINE RATIO
Creatinine, Urine: 78.7 mg/dL
Microalb/Creat Ratio: 4 mg/g creat (ref 0–29)
Microalbumin, Urine: 3.1 ug/mL

## 2022-02-05 DIAGNOSIS — E1165 Type 2 diabetes mellitus with hyperglycemia: Secondary | ICD-10-CM | POA: Diagnosis not present

## 2022-03-21 ENCOUNTER — Other Ambulatory Visit: Payer: Self-pay | Admitting: Family Medicine

## 2022-03-21 DIAGNOSIS — I10 Essential (primary) hypertension: Secondary | ICD-10-CM

## 2022-03-22 ENCOUNTER — Other Ambulatory Visit: Payer: Self-pay | Admitting: Family Medicine

## 2022-03-22 DIAGNOSIS — E11319 Type 2 diabetes mellitus with unspecified diabetic retinopathy without macular edema: Secondary | ICD-10-CM

## 2022-03-22 NOTE — Telephone Encounter (Signed)
Requested Prescriptions  Pending Prescriptions Disp Refills  . TRULICITY 1.5 WT/8.8EK SOPN [Pharmacy Med Name: TRULICITY 1.5 CM/0.3 ML PEN] 2 mL 1    Sig: INJECT 1.5 MG UNDER THE SKIN ONCE WEEKLY     Endocrinology:  Diabetes - GLP-1 Receptor Agonists Failed - 03/22/2022  2:11 PM      Failed - HBA1C is between 0 and 7.9 and within 180 days    Hemoglobin A1C  Date Value Ref Range Status  02/01/2022 8.2 (A) 4.0 - 5.6 % Final  06/29/2020 9.0  Final   Hgb A1c MFr Bld  Date Value Ref Range Status  11/02/2021 8.1 (H) 4.8 - 5.6 % Final    Comment:             Prediabetes: 5.7 - 6.4          Diabetes: >6.4          Glycemic control for adults with diabetes: <7.0          Passed - Valid encounter within last 6 months    Recent Outpatient Visits          1 month ago Type 2 diabetes mellitus with retinopathy, without long-term current use of insulin, macular edema presence unspecified, unspecified laterality, unspecified retinopathy severity ([place])   [place], Donald E, MD   4 months ago Primary hypertension   [facility] Birdie Sons, MD   1 year ago Type 2 diabetes mellitus with retinopathy, without long-term current use of insulin, macular edema presence unspecified, unspecified laterality, unspecified retinopathy severity ([place])   [facility] Birdie Sons, MD   1 year ago Type 2 diabetes mellitus with retinopathy, without long-term current use of insulin, macular edema presence unspecified, unspecified laterality, unspecified retinopathy severity [facility])   [place], Donald E, MD   1 year ago Annual physical exam   [facility] Birdie Sons, MD      Future Appointments            In 1 month Fisher, Kirstie Peri, MD [facility], [place]

## 2022-04-10 ENCOUNTER — Other Ambulatory Visit: Payer: Self-pay | Admitting: Family Medicine

## 2022-04-10 DIAGNOSIS — I10 Essential (primary) hypertension: Secondary | ICD-10-CM

## 2022-05-09 NOTE — Progress Notes (Addendum)
Established patient visit  I,Daniel Alvarado,acting as a scribe for Lelon Huh, MD.,have documented all relevant documentation on the behalf of Lelon Huh, MD,as directed by  Lelon Huh, MD while in the presence of Lelon Huh, MD.   Patient: Daniel Alvarado   DOB: 13-Oct-1960   61 y.o. Male  MRN: [id number] Visit Date: 05/10/2022  Today's healthcare provider: Lelon Huh, MD   Chief Complaint  Patient presents with   Follow-up   Diabetes   Hypertension   Subjective    HPI  Diabetes Mellitus Type II, Follow-up  Lab Results  Component Value Date   HGBA1C 8.2 (A) 02/01/2022   HGBA1C 8.1 (H) 11/02/2021   HGBA1C 9.0 (H) 01/09/2021   Wt Readings from Last 3 Encounters:  05/10/22 253 lb (114.8 kg)  02/01/22 259 lb (117.5 kg)  10/30/21 262 lb 6.4 oz (119 kg)   Last seen for diabetes 3 months ago.  Management since then includes working on reducing the amount of sugar in his sweet tea. Consideration for adding pioglitazone or change Trulicity to [facility] if A1c is not improved. He did not tolerate higher dose of Trulicity in the past.  He reports good compliance with treatment. He is not having side effects. none   Home blood sugar records: fasting range: not checking  Episodes of hypoglycemia? No none   Current insulin regiment: none Most Recent Eye Exam: 05/17/2021, scheduled for 05/23/22  Pertinent Labs: Lab Results  Component Value Date   CHOL 160 11/02/2021   HDL 39 (L) 11/02/2021   LDLCALC 100 (H) 11/02/2021   TRIG 116 11/02/2021   CHOLHDL 4.1 11/02/2021   Lab Results  Component Value Date   NA 142 11/02/2021   K 3.6 11/02/2021   CREATININE 0.88 11/02/2021   EGFR 99 11/02/2021   MICROALBUR 20 05/16/2016   LABMICR 3.1 02/01/2022     ---------------------------------------------------------------------------------------------------   Hypertension, follow-up  BP Readings from Last 3 Encounters:  05/10/22 135/79  02/01/22 134/66   10/30/21 128/64   Wt Readings from Last 3 Encounters:  05/10/22 253 lb (114.8 kg)  02/01/22 259 lb (117.5 kg)  10/30/21 262 lb 6.4 oz (119 kg)     He was last seen for hypertension 3 months ago.  BP at that visit was 134/66. Management since that visit includes continue same medication.  He reports good compliance with treatment. He is not having side effects. none He is following a Regular diet. He is exercising. He does not smoke.  Use of agents associated with hypertension: NSAIDS.   Outside blood pressures are just bought a bp cuff for home use.  ---------------------------------------------------------------------------------------------------   Medications: Outpatient Medications Prior to Visit  Medication Sig   aspirin 81 MG tablet    felodipine (PLENDIL) 10 MG 24 hr tablet TAKE ONE TABLET BY MOUTH DAILY FOR BLOOD PRESSURE   JARDIANCE 25 MG TABS tablet TAKE ONE TABLET BY MOUTH DAILY   KLOR-CON M20 20 MEQ tablet TAKE ONE TABLET BY MOUTH EVERY MORNING   lisinopril-hydrochlorothiazide (ZESTORETIC) 20-25 MG tablet TAKE TWO TABLETS BY MOUTH DAILY   metFORMIN (GLUCOPHAGE-XR) 500 MG 24 hr tablet TAKE TWO TABLETS BY MOUTH TWICE A DAY   metoprolol succinate (TOPROL-XL) 100 MG 24 hr tablet TAKE ONE TABLET BY MOUTH DAILY   MULTIPLE VITAMIN PO    OMEGA-3 FATTY ACIDS PO Take 1,200 mcg by mouth.    simvastatin (ZOCOR) 40 MG tablet TAKE ONE TABLET BY MOUTH AT BEDTIME   TRULICITY 1.5 HF/4.1SE  SOPN INJECT 1.5 MG UNDER THE SKIN ONCE WEEKLY   No facility-administered medications prior to visit.    Review of Systems  Constitutional:  Negative for appetite change, chills and fever.  Respiratory:  Negative for chest tightness, shortness of breath and wheezing.   Cardiovascular:  Negative for chest pain and palpitations.  Gastrointestinal:  Negative for abdominal pain, nausea and vomiting.  Genitourinary:  Positive for decreased urine volume, difficulty urinating and frequency.  Negative for dysuria, flank pain and urgency.       Objective    BP 135/79 (BP Location: Left Arm, Patient Position: Sitting, Cuff Size: Large)   Pulse 73   Resp 16   Wt 253 lb (114.8 kg)   SpO2 98%   BMI 35.29 kg/m    Physical Exam   General appearance: Mildly obese male, cooperative and in no acute distress Head: Normocephalic, without obvious abnormality, atraumatic Respiratory: Respirations even and unlabored, normal respiratory rate Extremities: All extremities are intact.  Skin: Skin color, texture, turgor normal. No rashes seen  Psych: Appropriate mood and affect. Neurologic: Mental status: Alert, oriented to person, place, and time, thought content appropriate.   Results for orders placed or performed in visit on 05/10/22  POCT HgB A1C  Result Value Ref Range   Hemoglobin A1C 7.7 (A) 4.0 - 5.6 %   Est. average glucose Bld gHb Est-mCnc 174     Assessment & Plan     1. Type 2 diabetes mellitus with retinopathy, without long-term current use of insulin, macular edema presence unspecified, unspecified laterality, unspecified retinopathy severity (H[facility]) Improving, tolerating current medications, although did not tolerate higher doses of Trulicity in the past. Continue current medications.    2. Primary hypertension Fairly well controled. Continue current medications.    3. Benign prostatic hyperplasia with weak urinary stream Has been followed for this in the past by Dr. Yves Dill. His last PSA was 1.1 in March.  - Ambulatory referral to Urology  4. Need for Tdap vaccination  - Tdap vaccine greater than or equal to 7yo IM   He declined flu vaccine.   Follow up 3-4 months.       The entirety of the information documented in the History of Present Illness, Review of Systems and Physical Exam were personally obtained by me. Portions of this information were initially documented by the CMA and reviewed by me for thoroughness and accuracy.     Lelon Huh, MD   [facility] [phone] (phone) [phone] (fax)  [place]

## 2022-05-10 ENCOUNTER — Ambulatory Visit: Payer: BC Managed Care – PPO | Admitting: Family Medicine

## 2022-05-10 ENCOUNTER — Encounter: Payer: Self-pay | Admitting: Family Medicine

## 2022-05-10 VITALS — BP 135/79 | HR 73 | Resp 16 | Wt 253.0 lb

## 2022-05-10 DIAGNOSIS — E11319 Type 2 diabetes mellitus with unspecified diabetic retinopathy without macular edema: Secondary | ICD-10-CM

## 2022-05-10 DIAGNOSIS — N400 Enlarged prostate without lower urinary tract symptoms: Secondary | ICD-10-CM | POA: Insufficient documentation

## 2022-05-10 DIAGNOSIS — R3912 Poor urinary stream: Secondary | ICD-10-CM

## 2022-05-10 DIAGNOSIS — I10 Essential (primary) hypertension: Secondary | ICD-10-CM

## 2022-05-10 DIAGNOSIS — N401 Enlarged prostate with lower urinary tract symptoms: Secondary | ICD-10-CM

## 2022-05-10 DIAGNOSIS — Z23 Encounter for immunization: Secondary | ICD-10-CM

## 2022-05-10 LAB — POCT GLYCOSYLATED HEMOGLOBIN (HGB A1C)
Est. average glucose Bld gHb Est-mCnc: 174
Hemoglobin A1C: 7.7 % — AB (ref 4.0–5.6)

## 2022-05-20 ENCOUNTER — Other Ambulatory Visit: Payer: Self-pay | Admitting: Family Medicine

## 2022-05-20 DIAGNOSIS — E11319 Type 2 diabetes mellitus with unspecified diabetic retinopathy without macular edema: Secondary | ICD-10-CM

## 2022-05-20 MED ORDER — TRULICITY 1.5 MG/0.5ML ~~LOC~~ SOAJ: SUBCUTANEOUS | 4 refills | Status: DC

## 2022-05-20 NOTE — Telephone Encounter (Signed)
Last office visit: 05/10/2022  Next OV: 09/13/2022  Last refill: 03/22/2022 QTY: 6m with 1 refill

## 2022-05-20 NOTE — Telephone Encounter (Signed)
[facility] faxed refill request for the following medications:  TRULICITY 1.5 WI/0.9BD SOPN   Please advise.

## 2022-05-20 NOTE — Addendum Note (Signed)
Addended by: Randal Buba on: 05/20/2022 03:42 PM   Modules accepted: Orders

## 2022-05-23 DIAGNOSIS — H2513 Age-related nuclear cataract, bilateral: Secondary | ICD-10-CM | POA: Diagnosis not present

## 2022-05-23 DIAGNOSIS — E113291 Type 2 diabetes mellitus with mild nonproliferative diabetic retinopathy without macular edema, right eye: Secondary | ICD-10-CM | POA: Diagnosis not present

## 2022-05-30 ENCOUNTER — Encounter: Payer: Self-pay | Admitting: Family Medicine

## 2022-05-31 DIAGNOSIS — R3915 Urgency of urination: Secondary | ICD-10-CM | POA: Diagnosis not present

## 2022-05-31 DIAGNOSIS — N401 Enlarged prostate with lower urinary tract symptoms: Secondary | ICD-10-CM | POA: Diagnosis not present

## 2022-05-31 DIAGNOSIS — Z125 Encounter for screening for malignant neoplasm of prostate: Secondary | ICD-10-CM | POA: Diagnosis not present

## 2022-05-31 DIAGNOSIS — N5201 Erectile dysfunction due to arterial insufficiency: Secondary | ICD-10-CM | POA: Diagnosis not present

## 2022-05-31 DIAGNOSIS — R351 Nocturia: Secondary | ICD-10-CM | POA: Diagnosis not present

## 2022-06-13 DIAGNOSIS — N401 Enlarged prostate with lower urinary tract symptoms: Secondary | ICD-10-CM | POA: Diagnosis not present

## 2022-06-13 DIAGNOSIS — R350 Frequency of micturition: Secondary | ICD-10-CM | POA: Diagnosis not present

## 2022-06-20 DIAGNOSIS — N5201 Erectile dysfunction due to arterial insufficiency: Secondary | ICD-10-CM | POA: Diagnosis not present

## 2022-06-20 DIAGNOSIS — N401 Enlarged prostate with lower urinary tract symptoms: Secondary | ICD-10-CM | POA: Diagnosis not present

## 2022-06-20 DIAGNOSIS — R3914 Feeling of incomplete bladder emptying: Secondary | ICD-10-CM | POA: Diagnosis not present

## 2022-08-06 DIAGNOSIS — E1165 Type 2 diabetes mellitus with hyperglycemia: Secondary | ICD-10-CM | POA: Diagnosis not present

## 2022-09-03 ENCOUNTER — Telehealth: Payer: Self-pay

## 2022-09-03 NOTE — Telephone Encounter (Signed)
Spoke with patient.   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  The one we have is not in effect.

## 2022-09-03 NOTE — Telephone Encounter (Signed)
Copied from [place] [id number]. Topic: General - Inquiry >> Sep 03, 2022  4:02 PM Erskine Squibb wrote: Reason for CRM: The patient has BCBS but Gloris Ham is listed in Epic. The new insurance has been copied and scanned last year and needs to show in [place]. Please assist patient further

## 2022-09-13 ENCOUNTER — Ambulatory Visit: Payer: Self-pay

## 2022-09-13 ENCOUNTER — Ambulatory Visit: Payer: BC Managed Care – PPO | Admitting: Family Medicine

## 2022-09-13 ENCOUNTER — Encounter: Payer: Self-pay | Admitting: Family Medicine

## 2022-09-13 VITALS — BP 146/92 | HR 69 | Wt 261.2 lb

## 2022-09-13 DIAGNOSIS — N401 Enlarged prostate with lower urinary tract symptoms: Secondary | ICD-10-CM

## 2022-09-13 DIAGNOSIS — M5432 Sciatica, left side: Secondary | ICD-10-CM | POA: Diagnosis not present

## 2022-09-13 DIAGNOSIS — E11319 Type 2 diabetes mellitus with unspecified diabetic retinopathy without macular edema: Secondary | ICD-10-CM | POA: Diagnosis not present

## 2022-09-13 LAB — POCT GLYCOSYLATED HEMOGLOBIN (HGB A1C)
Est. average glucose Bld gHb Est-mCnc: 186
Hemoglobin A1C: 8.1 % — AB (ref 4.0–5.6)

## 2022-09-13 MED ORDER — TADALAFIL 5 MG PO TABS: 5.0000 mg | ORAL_TABLET | Freq: Every day | ORAL | 3 refills | Status: DC

## 2022-09-13 MED ORDER — TIRZEPATIDE 2.5 MG/0.5ML ~~LOC~~ SOAJ: 2.5000 mg | SUBCUTANEOUS | 0 refills | Status: DC

## 2022-09-13 MED ORDER — PREDNISONE 10 MG PO TABS: ORAL_TABLET | ORAL | 0 refills | Status: AC

## 2022-09-13 NOTE — Progress Notes (Signed)
I,Daniel Alvarado,acting as a Education administrator for Daniel Alvarado.,have documented all relevant documentation on the behalf of Daniel Alvarado,as directed by  Daniel Alvarado while in the presence of Daniel Alvarado.   Established patient visit   Patient: Daniel Alvarado   DOB: December 04, 1960   62 y.o. Male  MRN: [id number] Visit Date: 09/13/2022  Today's healthcare provider: Lelon Huh, Alvarado   Chief Complaint  Patient presents with   Diabetes   Hypertension   Hyperlipidemia   Subjective    HPI   Hypertension, follow-up  BP Readings from Last 3 Encounters:  05/10/22 135/79  02/01/22 134/66  10/30/21 128/64   Wt Readings from Last 3 Encounters:  05/10/22 253 lb (114.8 kg)  02/01/22 259 lb (117.5 kg)  10/30/21 262 lb 6.4 oz (119 kg)     He was last seen for hypertension 4 months ago.  BP at that visit was 135/79. Management since that visit includes continue current treatment.  Outside blood pressures are not checked regularly.  ---------------------------------------------------------------------------------------------------  Diabetes Mellitus Type II, Follow-up  Lab Results  Component Value Date   HGBA1C 7.7 (A) 05/10/2022   HGBA1C 8.2 (A) 02/01/2022   HGBA1C 8.1 (H) 11/02/2021   Wt Readings from Last 3 Encounters:  05/10/22 253 lb (114.8 kg)  02/01/22 259 lb (117.5 kg)  10/30/21 262 lb 6.4 oz (119 kg)   Last seen for diabetes 4 months ago.  Management since then includes continue current treatment.  Home blood sugar records:  not being checked  Episodes of hypoglycemia? 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 Patient reports this combination has been very effective and  well tolerated, although he did feel very weak and dizzy when he tried taking 2 Cialis. Daniel Alvarado has now retired and he would like to have these medications refilled here.   He also reports flare up of sciatica with pain in mid lower back radiating down the back of his left leg and foot.    Medications: Outpatient Medications Prior to Visit  Medication Sig   aspirin 81 MG tablet    dutasteride (AVODART) 0.5 MG capsule Take 0.5 mg by mouth daily.   felodipine (PLENDIL) 10 MG 24 hr tablet TAKE ONE TABLET BY MOUTH DAILY FOR BLOOD PRESSURE   JARDIANCE 25 MG TABS tablet TAKE ONE TABLET BY MOUTH DAILY   KLOR-CON M20 20 MEQ tablet TAKE ONE TABLET BY MOUTH EVERY MORNING   lisinopril-hydrochlorothiazide (ZESTORETIC) 20-25 MG tablet TAKE TWO TABLETS BY MOUTH DAILY   metFORMIN (GLUCOPHAGE-XR) 500 MG 24 hr tablet TAKE TWO TABLETS BY MOUTH TWICE A DAY   metoprolol succinate (TOPROL-XL) 100 MG 24 hr tablet TAKE ONE TABLET BY MOUTH DAILY   MULTIPLE VITAMIN PO    OMEGA-3 FATTY ACIDS PO Take 1,200 mcg by mouth.    simvastatin (ZOCOR) 40 MG tablet TAKE ONE TABLET BY MOUTH AT BEDTIME   tamsulosin (FLOMAX) 0.4 MG CAPS capsule Take 0.4 mg by mouth daily.   Dulaglutide (TRULICITY) 1.5 HA/1.9FX SOPN INJECT 1.5 MG UNDER THE SKIN ONCE WEEKLY   tadalafil (CIALIS) 5 MG tablet Take 5 mg by mouth daily.   No facility-administered medications prior to visit.    Review of Systems  Constitutional:  Negative for appetite change, chills and fever.  Respiratory:  Negative for chest tightness, shortness of breath and wheezing.   Cardiovascular:  Negative for chest pain and palpitations.  Gastrointestinal:  Negative for abdominal pain, nausea and vomiting.       Objective    BP (!) 146/92 (BP Location: Right Arm, Patient Position: Sitting, Cuff Size: Large)   Pulse 69   Wt 261 lb 3.2 oz (118.5 kg)   SpO2 100%   BMI 36.43 kg/m    Physical Exam   General: Appearance:    Mildly obese male in no acute distress   Eyes:    PERRL, conjunctiva/corneas clear, EOM's intact       Lungs:     Clear to auscultation bilaterally, respirations unlabored  Heart:    Normal heart rate. Normal rhythm. No murmurs, rubs, or gallops.    MS:   All extremities are intact.    Neurologic:   Awake, alert, oriented x 3. No apparent focal neurological defect.         Results for orders placed or performed in visit on 09/13/22  POCT HgB A1C  Result Value Ref Range   Hemoglobin A1C 8.1 (A) 4.0 - 5.6 %   Est. average glucose Bld gHb Est-mCnc 186     Assessment & Plan     1. Type 2 diabetes mellitus with retinopathy, without long-term current use of insulin, macular edema presence unspecified, unspecified laterality, unspecified retinopathy severity (Daniel Alvarado) Not at goal and did not tolerate higher doses of Trulicity. Will change to  tirzepatide [facility]) 2.5 MG/0.5ML Pen; Inject 2.5 mg into the skin once a week. Take in place of Trulicity  Dispense: 2 mL; Refill: 0  Titrate to '5mg'$  in 1 month if tolerating.   2. Benign prostatic hyperplasia with lower urinary tract symptoms, symptom details unspecified Much improved since Daniel Alvarado started on medications below which are refilled today since Daniel Alvarado has retired.  - dutasteride (AVODART) 0.5 MG capsule; Take 0.5 mg by mouth daily. - tamsulosin (FLOMAX) 0.4 MG CAPS capsule; Take 0.4 mg by mouth daily. - tadalafil (CIALIS) 5 MG tablet; Take 1 tablet (5 mg total) by mouth daily. For BPH N40.0  Dispense: 90 tablet; Refill: 3  3. Sciatica of left side  - predniSONE (DELTASONE) 10 MG tablet; 6 tablets for 1 days, then 5 for 1 days, then 4 for 1 days, then 3 for 1 days, then 2 for 1 days, then 1 until gone  Dispense: 42 tablet; Refill: 0   Call if symptoms change or if not rapidly improving.    Future Appointments  Date Time Provider Daniel Alvarado  01/06/2023  9:40 AM Daniel Alvarado, Daniel Peri, [facility]ado [facility]        The entirety of the information documented in the History of  Present Illness, Review of Systems and Physical Exam were personally obtained by me. Portions of this information were initially documented by the CMA and reviewed by me for thoroughness and accuracy.     Daniel Alvarado  [facility] [phone] (phone) [phone] (fax)  [place]

## 2022-09-13 NOTE — Telephone Encounter (Signed)
That's correct, we are doing a rapid taper but continuing on the low dose of '10mg'$  for a few weeks.

## 2022-09-13 NOTE — Patient Instructions (Signed)
Please review the attached list of medications and notify my office if there are any errors.   Work on getting your sugars down by eating better and exercising average of 30 minutes a day. If you sugars don't come back down.

## 2022-09-13 NOTE — Telephone Encounter (Signed)
Cash at pharmacy advised

## 2022-09-13 NOTE — Telephone Encounter (Signed)
Message from Bayard Beaver sent at 09/13/2022 11:03 AM EST  Summary: med ?   Cash from [employer] called in about directions and quantity of prednisone. He states the pack normally contains 21 but 42 is being requested.         Chief Complaint: need to clarify amount dispense #21 or #42 Symptoms: n/a Frequency: n/a Pertinent Negatives: Patient denies n/a Disposition: '[]'$ ED /'[]'$ Urgent Care (no appt availability in office) / '[]'$ Appointment(In office/virtual)/ '[]'$  [place] [facility]/ '[]'$ Home Care/ '[]'$ Refused Recommended Disposition /'[]'$ [place] [facility]/ '[x]'$  Follow-up with PCP Additional Notes: called [place] and spoke to [employer]. Cash would like prescription clarified and please notify pharmacy.   Reason for Disposition  [1] Pharmacy calling with prescription question AND [2] triager unable to answer question  Answer Assessment - Initial Assessment Questions 1. NAME of MEDICINE: "What medicine(s) are you calling about?"     Prednisone 2. QUESTION: "What is your question?" (e.g., double dose of medicine, side effect)     The amount dispense is #42 and usually only #21 is dispensed 3. PRESCRIBER: "Who prescribed the medicine?" Reason: if prescribed by specialist, call should be referred to that group.     PCP 4. SYMPTOMS: "Do you have any symptoms?" If Yes, ask: "What symptoms are you having?"  "How bad are the symptoms (e.g., mild, moderate, severe)     N/a 5. PREGNANCY:  "Is there any chance that you are pregnant?" "When was your last menstrual period?"     N/a  Protocols used: Medication Question Call-A-AH

## 2022-09-16 ENCOUNTER — Telehealth: Payer: Self-pay | Admitting: Family Medicine

## 2022-09-16 DIAGNOSIS — I10 Essential (primary) hypertension: Secondary | ICD-10-CM

## 2022-09-16 MED ORDER — LISINOPRIL-HYDROCHLOROTHIAZIDE 20-25 MG PO TABS: 2.0000 | ORAL_TABLET | Freq: Every day | ORAL | 1 refills | Status: DC

## 2022-09-16 NOTE — Telephone Encounter (Signed)
[facility] faxed refill request for the following medications:   lisinopril-hydrochlorothiazide (ZESTORETIC) 20-25 MG    Please advise

## 2022-09-17 ENCOUNTER — Other Ambulatory Visit: Payer: Self-pay | Admitting: Family Medicine

## 2022-09-17 DIAGNOSIS — I10 Essential (primary) hypertension: Secondary | ICD-10-CM

## 2022-09-17 NOTE — Telephone Encounter (Signed)
Future visit in 3 months .  Requested Prescriptions  Pending Prescriptions Disp Refills   metoprolol succinate (TOPROL-XL) 100 MG 24 hr tablet [Pharmacy Med Name: METOPROLOL SUCC ER 100 MG TAB] 90 tablet 1    Sig: TAKE ONE TABLET BY MOUTH DAILY     Cardiovascular:  Beta Blockers Failed - 09/17/2022  6:21 AM      Failed - Last BP in normal range    BP Readings from Last 1 Encounters:  09/13/22 (!) 146/92         Passed - Last Heart Rate in normal range    Pulse Readings from Last 1 Encounters:  09/13/22 69         Passed - Valid encounter within last 6 months    Recent Outpatient Visits           4 days ago Type 2 diabetes mellitus with retinopathy, without long-term current use of insulin, macular edema presence unspecified, unspecified laterality, unspecified retinopathy severity ([place])   [facility] Birdie Sons, MD   4 months ago Type 2 diabetes mellitus with retinopathy, without long-term current use of insulin, macular edema presence unspecified, unspecified laterality, unspecified retinopathy severity ([place])   [facility] Birdie Sons, MD   7 months ago Type 2 diabetes mellitus with retinopathy, without long-term current use of insulin, macular edema presence unspecified, unspecified laterality, unspecified retinopathy severity ([place])   [facility] Birdie Sons, MD   10 months ago Primary hypertension   [facility] Birdie Sons, MD   1 year ago Type 2 diabetes mellitus with retinopathy, without long-term current use of insulin, macular edema presence unspecified, unspecified laterality, unspecified retinopathy severity [facility])   [place], Kirstie Peri, MD       Future Appointments             In 3 months Fisher, Kirstie Peri, MD [facility]

## 2022-10-05 ENCOUNTER — Other Ambulatory Visit: Payer: Self-pay | Admitting: Family Medicine

## 2022-10-05 DIAGNOSIS — E11319 Type 2 diabetes mellitus with unspecified diabetic retinopathy without macular edema: Secondary | ICD-10-CM

## 2022-10-05 MED ORDER — TIRZEPATIDE 5 MG/0.5ML ~~LOC~~ SOAJ: 5.0000 mg | SUBCUTANEOUS | 1 refills | Status: DC

## 2022-10-07 ENCOUNTER — Other Ambulatory Visit: Payer: Self-pay | Admitting: Family Medicine

## 2022-10-20 ENCOUNTER — Other Ambulatory Visit: Payer: Self-pay | Admitting: Family Medicine

## 2022-12-12 ENCOUNTER — Telehealth: Payer: Self-pay

## 2022-12-12 MED ORDER — TIRZEPATIDE 7.5 MG/0.5ML ~~LOC~~ SOAJ: 7.5000 mg | SUBCUTANEOUS | 2 refills | Status: DC

## 2022-12-12 NOTE — Telephone Encounter (Signed)
OK, have sent higher dose of 7.5mg  to [facility]

## 2022-12-12 NOTE — Telephone Encounter (Signed)
Advised 

## 2022-12-12 NOTE — Telephone Encounter (Signed)
Copied from CRM [id number]. Topic: General - Other >> Dec 11, 2022  9:36 AM Everette C wrote: Reason for CRM: The patient has called regarding their prescription for tirzepatide [facility]) 5 MG/0.5ML Pen [[id number]]   The patient shares that their medication is potentially ineffective and would like to request an increase in th medication   Please contact the patient further when possible

## 2022-12-16 ENCOUNTER — Other Ambulatory Visit: Payer: Self-pay | Admitting: Family Medicine

## 2022-12-16 DIAGNOSIS — I10 Essential (primary) hypertension: Secondary | ICD-10-CM

## 2022-12-16 DIAGNOSIS — E11649 Type 2 diabetes mellitus with hypoglycemia without coma: Secondary | ICD-10-CM

## 2023-01-06 ENCOUNTER — Ambulatory Visit: Payer: BC Managed Care – PPO | Admitting: Family Medicine

## 2023-01-06 ENCOUNTER — Encounter: Payer: Self-pay | Admitting: Family Medicine

## 2023-01-06 VITALS — BP 119/64 | HR 70 | Temp 98.5°F | Ht 71.0 in | Wt 251.6 lb

## 2023-01-06 DIAGNOSIS — E11319 Type 2 diabetes mellitus with unspecified diabetic retinopathy without macular edema: Secondary | ICD-10-CM

## 2023-01-06 DIAGNOSIS — Z125 Encounter for screening for malignant neoplasm of prostate: Secondary | ICD-10-CM | POA: Diagnosis not present

## 2023-01-06 DIAGNOSIS — I10 Essential (primary) hypertension: Secondary | ICD-10-CM | POA: Diagnosis not present

## 2023-01-06 DIAGNOSIS — E785 Hyperlipidemia, unspecified: Secondary | ICD-10-CM | POA: Diagnosis not present

## 2023-01-06 LAB — POCT GLYCOSYLATED HEMOGLOBIN (HGB A1C)
Est. average glucose Bld gHb Est-mCnc: 169
Hemoglobin A1C: 7.5 % — AB (ref 4.0–5.6)

## 2023-01-06 NOTE — Progress Notes (Signed)
I,Sha'taria Tyson,acting as a Neurosurgeon for Mila Merry, MD.,have documented all relevant documentation on the behalf of Mila Merry, MD,as directed by  Mila Merry, MD while in the presence of Mila Merry, MD.   Established patient visit   Patient: Daniel Alvarado   DOB: 1961/08/07   61 y.o. Male  MRN: [id number] Visit Date: 01/06/2023  Today's healthcare provider: Mila Merry, MD    Subjective    HPI  Diabetes Mellitus Type II, follow-up  Lab Results  Component Value Date   HGBA1C 8.1 (A) 09/13/2022   HGBA1C 7.7 (A) 05/10/2022   HGBA1C 8.2 (A) 02/01/2022   Last seen for diabetes 16 weeks ago.  Management since then includes continuing the same treatment. Has since increase Mounjaro to 7.5mg  starting last week.  He reports excellent compliance with treatment. He is not having side effects.   Home blood sugar records:  not being checked  Episodes of hypoglycemia? No    Current insulin regiment: none Most Recent Eye Exam: Last September  --------------------------------------------------------------------------------------------------- Hypertension, follow-up  BP Readings from Last 3 Encounters:  09/13/22 (!) 146/92  05/10/22 135/79  02/01/22 134/66   Wt Readings from Last 3 Encounters:  09/13/22 261 lb 3.2 oz (118.5 kg)  05/10/22 253 lb (114.8 kg)  02/01/22 259 lb (117.5 kg)     He was last seen for hypertension 8 months ago. (02/01/22) BP at that visit was 135/79. Management since that visit includes continue current treatment.  --------------------------------------------------------------------------------------------------- Lipid/Cholesterol, follow-up  Last Lipid Panel: Lab Results  Component Value Date   CHOL 160 11/02/2021   LDLCALC 100 (H) 11/02/2021   HDL 39 (L) 11/02/2021   TRIG 116 11/02/2021    He was last seen for this 14 months ago. (10/30/21) Management since that visit includes continue statin.  Symptoms: Yes appetite  changes No foot ulcerations  No chest pain No chest pressure/discomfort  No dyspnea No orthopnea  No fatigue No lower extremity edema  No palpitations No paroxysmal nocturnal dyspnea  No nausea Yes numbness or tingling of extremity  No polydipsia No polyuria  No speech difficulty No syncope   Last metabolic panel Lab Results  Component Value Date   GLUCOSE 191 (H) 11/02/2021   NA 142 11/02/2021   K 3.6 11/02/2021   BUN 17 11/02/2021   CREATININE 0.88 11/02/2021   EGFR 99 11/02/2021   GFRNONAA 90 09/12/2020   CALCIUM 9.3 11/02/2021   AST 18 11/02/2021   ALT 24 11/02/2021   The 10-year ASCVD risk score (Arnett DK, et al., 2019) is: 22.3%  ---------------------------------------------------------------------------------------------------   Medications: Outpatient Medications Prior to Visit  Medication Sig   aspirin 81 MG tablet    dutasteride (AVODART) 0.5 MG capsule Take 0.5 mg by mouth daily.   felodipine (PLENDIL) 10 MG 24 hr tablet TAKE ONE TABLET BY MOUTH DAILY FOR BLOOD PRESSURE   JARDIANCE 25 MG TABS tablet TAKE ONE TABLET BY MOUTH DAILY   KLOR-CON M20 20 MEQ tablet TAKE ONE TABLET BY MOUTH EVERY MORNING   lisinopril-hydrochlorothiazide (ZESTORETIC) 20-25 MG tablet Take 2 tablets by mouth daily.   metFORMIN (GLUCOPHAGE-XR) 500 MG 24 hr tablet TAKE TWO TABLETS BY MOUTH TWICE A DAY   metoprolol succinate (TOPROL-XL) 100 MG 24 hr tablet TAKE ONE TABLET BY MOUTH DAILY   MULTIPLE VITAMIN PO    OMEGA-3 FATTY ACIDS PO Take 1,200 mcg by mouth.    simvastatin (ZOCOR) 40 MG tablet TAKE ONE TABLET BY MOUTH AT BEDTIME   tadalafil (  CIALIS) 5 MG tablet Take 1 tablet (5 mg total) by mouth daily. For BPH N40.0   tamsulosin (FLOMAX) 0.4 MG CAPS capsule Take 0.4 mg by mouth daily.   tirzepatide (MOUNJARO) 7.5 MG/0.5ML Pen Inject 7.5 mg into the skin once a week.   No facility-administered medications prior to visit.    Review of Systems  Constitutional:  Negative for appetite  change, chills and fever.  Respiratory:  Negative for chest tightness, shortness of breath and wheezing.   Cardiovascular:  Negative for chest pain and palpitations.  Gastrointestinal:  Negative for abdominal pain, nausea and vomiting.       Objective    BP 119/64 (BP Location: Right Arm, Patient Position: Sitting, Cuff Size: Large)   Pulse 70   Temp 98.5 F (36.9 C) (Oral)   Ht 5\' 11"  (1.803 m)   Wt 251 lb 9.6 oz (114.1 kg)   SpO2 98%   BMI 35.09 kg/m    Physical Exam  General appearance: Obese male, cooperative and in no acute distress Head: Normocephalic, without obvious abnormality, atraumatic Respiratory: Respirations even and unlabored, normal respiratory rate Extremities: All extremities are intact.  Skin: Skin color, texture, turgor normal. No rashes seen  Psych: Appropriate mood and affect. Neurologic: Mental status: Alert, oriented to person, place, and time, thought content appropriate.   Results for orders placed or performed in visit on 01/06/23  POCT HgB A1C  Result Value Ref Range   Hemoglobin A1C 7.5 (A) 4.0 - 5.6 %   Est. average glucose Bld gHb Est-mCnc 169     Assessment & Plan     1. Type 2 diabetes mellitus with retinopathy, without long-term current use of insulin, macular edema presence unspecified, unspecified laterality, unspecified retinopathy severity (HCC) Is tolerating recent increase to 7.5mg  Mounjaro with no adverse effects. Continue current medications.  Recheck A1c in 3 months.  - CBC - Comprehensive metabolic panel  2. Primary hypertension Well controlled.  Continue reducing felodipine if SBP drops <110.   3. Hyperlipidemia, unspecified hyperlipidemia type He is tolerating simvastatin well with no adverse effects.   - Lipid panel  4. Morbid obesity (HCC) Expect modest weight loss with continued use of Mounjaro  5. Prostate cancer screening  - PSA Total (Reflex To Free) ([facility] only)      The entirety of the information  documented in the History of Present Illness, Review of Systems and Physical Exam were personally obtained by me. Portions of this information were initially documented by the CMA and reviewed by me for thoroughness and accuracy.     Mila Merry, MD  [facility] [phone] (phone) [phone] (fax)  [facility]

## 2023-01-06 NOTE — Patient Instructions (Signed)
.   Please review the attached list of medications and notify my office if there are any errors.   . Please bring all of your medications to every appointment so we can make sure that our medication list is the same as yours.   

## 2023-01-07 LAB — CBC
Hematocrit: 47.5 % (ref 37.5–51.0)
Hemoglobin: 15.6 g/dL (ref 13.0–17.7)
MCH: 29.1 pg (ref 26.6–33.0)
MCHC: 32.8 g/dL (ref 31.5–35.7)
MCV: 89 fL (ref 79–97)
Platelets: 208 10*3/uL (ref 150–450)
RBC: 5.37 x10E6/uL (ref 4.14–5.80)
RDW: 12.3 % (ref 11.6–15.4)
WBC: 11.6 10*3/uL — ABNORMAL HIGH (ref 3.4–10.8)

## 2023-01-07 LAB — LIPID PANEL
Chol/HDL Ratio: 4.1 ratio (ref 0.0–5.0)
Cholesterol, Total: 150 mg/dL (ref 100–199)
HDL: 37 mg/dL — ABNORMAL LOW (ref >39)
LDL Chol Calc (NIH): 83 mg/dL (ref 0–99)
Triglycerides: 174 mg/dL — ABNORMAL HIGH (ref 0–149)
VLDL Cholesterol Cal: 30 mg/dL (ref 5–40)

## 2023-01-07 LAB — COMPREHENSIVE METABOLIC PANEL
ALT: 20 IU/L (ref 0–44)
AST: 18 IU/L (ref 0–40)
Albumin/Globulin Ratio: 1.6 (ref 1.2–2.2)
Albumin: 3.9 g/dL (ref 3.8–4.9)
Alkaline Phosphatase: 72 IU/L (ref 44–121)
BUN/Creatinine Ratio: 21 (ref 10–24)
BUN: 21 mg/dL (ref 8–27)
Bilirubin Total: 0.3 mg/dL (ref 0.0–1.2)
CO2: 24 mmol/L (ref 20–29)
Calcium: 9.9 mg/dL (ref 8.6–10.2)
Chloride: 99 mmol/L (ref 96–106)
Creatinine, Ser: 1 mg/dL (ref 0.76–1.27)
Globulin, Total: 2.4 g/dL (ref 1.5–4.5)
Glucose: 135 mg/dL — ABNORMAL HIGH (ref 70–99)
Potassium: 3.8 mmol/L (ref 3.5–5.2)
Sodium: 140 mmol/L (ref 134–144)
Total Protein: 6.3 g/dL (ref 6.0–8.5)
eGFR: 86 mL/min/{1.73_m2} (ref >59)

## 2023-01-07 LAB — PSA TOTAL (REFLEX TO FREE): Prostate Specific Ag, Serum: 0.6 ng/mL (ref 0.0–4.0)

## 2023-01-22 DIAGNOSIS — E1165 Type 2 diabetes mellitus with hyperglycemia: Secondary | ICD-10-CM | POA: Diagnosis not present

## 2023-03-12 ENCOUNTER — Other Ambulatory Visit: Payer: Self-pay | Admitting: Family Medicine

## 2023-03-12 DIAGNOSIS — I10 Essential (primary) hypertension: Secondary | ICD-10-CM

## 2023-04-10 ENCOUNTER — Telehealth: Payer: Self-pay | Admitting: Family Medicine

## 2023-04-10 NOTE — Telephone Encounter (Signed)
[facility] is requesting prescription refill simvastatin (ZOCOR) 40 MG tablet   Please advise

## 2023-04-11 ENCOUNTER — Other Ambulatory Visit: Payer: Self-pay

## 2023-04-11 DIAGNOSIS — E785 Hyperlipidemia, unspecified: Secondary | ICD-10-CM

## 2023-04-11 MED ORDER — SIMVASTATIN 40 MG PO TABS
40.0000 mg | ORAL_TABLET | Freq: Every day | ORAL | 4 refills | Status: DC
Start: 2023-04-11 — End: 2024-07-03

## 2023-04-18 ENCOUNTER — Encounter: Payer: Self-pay | Admitting: Family Medicine

## 2023-04-18 ENCOUNTER — Ambulatory Visit (INDEPENDENT_AMBULATORY_CARE_PROVIDER_SITE_OTHER): Payer: BC Managed Care – PPO | Admitting: Family Medicine

## 2023-04-18 VITALS — BP 134/81 | HR 79 | Ht 71.0 in | Wt 255.1 lb

## 2023-04-18 DIAGNOSIS — Z7984 Long term (current) use of oral hypoglycemic drugs: Secondary | ICD-10-CM | POA: Diagnosis not present

## 2023-04-18 DIAGNOSIS — Z0001 Encounter for general adult medical examination with abnormal findings: Secondary | ICD-10-CM

## 2023-04-18 DIAGNOSIS — Z7985 Long-term (current) use of injectable non-insulin antidiabetic drugs: Secondary | ICD-10-CM

## 2023-04-18 DIAGNOSIS — Z86010 Personal history of colonic polyps: Secondary | ICD-10-CM

## 2023-04-18 DIAGNOSIS — E785 Hyperlipidemia, unspecified: Secondary | ICD-10-CM

## 2023-04-18 DIAGNOSIS — E11319 Type 2 diabetes mellitus with unspecified diabetic retinopathy without macular edema: Secondary | ICD-10-CM

## 2023-04-18 DIAGNOSIS — I10 Essential (primary) hypertension: Secondary | ICD-10-CM

## 2023-04-18 DIAGNOSIS — Z0000 Encounter for general adult medical examination without abnormal findings: Secondary | ICD-10-CM

## 2023-04-18 DIAGNOSIS — Z2821 Immunization not carried out because of patient refusal: Secondary | ICD-10-CM

## 2023-04-18 LAB — POCT GLYCOSYLATED HEMOGLOBIN (HGB A1C)
Est. average glucose Bld gHb Est-mCnc: 166
Hemoglobin A1C: 7.4 % — AB (ref 4.0–5.6)

## 2023-04-18 NOTE — Progress Notes (Signed)
Complete physical exam   Patient: Daniel Alvarado   DOB: 09-04-1960   62 y.o. Male  MRN: [id number] Visit Date: 04/18/2023  Today's healthcare provider: Mila Merry, MD   Chief Complaint  Patient presents with   Annual Exam   Subjective    Daniel Alvarado is a 62 y.o. male who presents today for a complete physical exam.  He reports consuming a  regular  diet.  He generally feels well. He reports sleeping fairly well. He does not have additional problems to discuss today.  Is doing well on Mounjaro 7.5, Jardiance and metformintolerating well except for some indigestion that OTC Tums will clear up.  Lab Results  Component Value Date   HGBA1C 7.5 (A) 01/06/2023   HGBA1C 8.1 (A) 09/13/2022   HGBA1C 7.7 (A) 05/10/2022   Doing well on statin and blood pressure medications.  Lab Results  Component Value Date   CHOL 150 01/06/2023   HDL 37 (L) 01/06/2023   LDLCALC 83 01/06/2023   TRIG 174 (H) 01/06/2023   CHOLHDL 4.1 01/06/2023   Lab Results  Component Value Date   NA 140 01/06/2023   K 3.8 01/06/2023   CREATININE 1.00 01/06/2023   EGFR 86 01/06/2023   GLUCOSE 135 (H) 01/06/2023   Lab Results  Component Value Date   PSA1 0.6 01/06/2023   PSA1 1.1 11/02/2021   PSA1 1.0 09/12/2020   PSA 0.9 05/19/2017   PSA 4.2 05/18/2014     Past Medical History:  Diagnosis Date   Allergy    2 weeks in September hay fever   Decreased libido    Diabetes mellitus without complication (HCC) 2016   Type 2   Heart murmur 1979   Heart operation in 1979   Hypertension 1979   Morbid obesity [facility])    Past Surgical History:  Procedure Laterality Date   AORTA SURGERY  09/02/1976   CARDIAC VALVE REPLACEMENT  1979   1979 operation   COLONOSCOPY WITH PROPOFOL N/A 08/18/2018   Procedure: COLONOSCOPY WITH PROPOFOL;  Surgeon: Wyline Mood, MD;  Location: [facility] ENDOSCOPY;  Service: Gastroenterology;  Laterality: N/A;   PROSTATE SURGERY  2018   Dr. 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 Continued on at same employer part time   Social Determinants of Health   Financial Resource Strain: Low Risk  (01/02/2023)   Overall Financial Resource Strain (CARDIA)    Difficulty of Paying Living Expenses: Not very hard  Food Insecurity: No Food Insecurity (01/02/2023)   Hunger Vital Sign  Worried About Programme researcher, broadcasting/film/video in the Last Year: Never true    Ran Out of Food in the Last Year: Never true  Transportation Needs: No Transportation Needs (01/02/2023)   PRAPARE - Administrator, Civil Service (Medical): No    Lack of Transportation (Non-Medical): No  Physical Activity: Insufficiently Active (01/02/2023)   Exercise Vital Sign    Days of Exercise per Week: 3 days    Minutes of Exercise per Session: 30 min  Stress: No Stress Concern Present (01/02/2023)   [employer] of Occupational Health - Occupational Stress Questionnaire    Feeling of Stress : Not at all  Social Connections: Moderately Isolated (01/02/2023)   Social Connection and Isolation Panel [NHANES]    Frequency of Communication with Friends and Family: More than three times a week    Frequency of Social Gatherings with Friends and Family:  Once a week    Attends Religious Services: 1 to 4 times per year    Active Member of [employer] or Organizations: No    Attends Engineer, structural: Not on file    Marital Status: Divorced  Intimate Partner Violence: Not on file   Family Status  Relation Name Status   Mother Daniel Alvarado Alive       broken neck for 20 yrs   Father  Deceased at age 58       multiple sclerosis  No partnership data on file   Family History  Problem Relation Age of Onset   Diabetes Mother    Arthritis Mother    Allergies  Allergen Reactions   Aspirin Other (See Comments)    Hyper    Patient Care Team: Malva Limes, MD as PCP - General (Family Medicine) Carrie Mew, OD (Optometry)   Medications: Outpatient Medications Prior to Visit  Medication Sig   aspirin 81 MG tablet    dutasteride (AVODART) 0.5 MG capsule Take 0.5 mg by mouth daily.   felodipine (PLENDIL) 10 MG 24 hr tablet TAKE ONE TABLET BY MOUTH DAILY FOR BLOOD PRESSURE   JARDIANCE 25 MG TABS tablet TAKE ONE TABLET BY MOUTH DAILY   KLOR-CON M20 20 MEQ tablet TAKE ONE TABLET BY MOUTH EVERY MORNING   lisinopril-hydrochlorothiazide (ZESTORETIC) 20-25 MG tablet TAKE 2 TABLETS BY MOUTH DAILY   metFORMIN (GLUCOPHAGE-XR) 500 MG 24 hr tablet TAKE TWO TABLETS BY MOUTH TWICE A DAY   metoprolol succinate (TOPROL-XL) 100 MG 24 hr tablet TAKE 1 TABLET BY MOUTH DAILY   MULTIPLE VITAMIN PO    OMEGA-3 FATTY ACIDS PO Take 1,200 mcg by mouth.    simvastatin (ZOCOR) 40 MG tablet Take 1 tablet (40 mg total) by mouth at bedtime.   tadalafil (CIALIS) 5 MG tablet Take 1 tablet (5 mg total) by mouth daily. For BPH N40.0   tamsulosin (FLOMAX) 0.4 MG CAPS capsule Take 0.4 mg by mouth daily.   tirzepatide (MOUNJARO) 7.5 MG/0.5ML Pen Inject 7.5 mg into the skin once a week.   No facility-administered medications prior to visit.    Review of Systems  Constitutional:  Negative for chills, diaphoresis and fever.  HENT:  Negative for congestion,  ear discharge, ear pain, hearing loss, nosebleeds, sore throat and tinnitus.   Eyes:  Negative for photophobia, pain, discharge and redness.  Respiratory:  Negative for cough, shortness of breath, wheezing and stridor.   Cardiovascular:  Negative for chest pain, palpitations and leg swelling.  Gastrointestinal:  Negative for abdominal pain, blood in stool, constipation,  diarrhea, nausea and vomiting.  Endocrine: Negative for polydipsia.  Genitourinary:  Negative for dysuria, flank pain, frequency, hematuria and urgency.  Musculoskeletal:  Negative for back pain, myalgias and neck pain.  Skin:  Negative for rash.  Allergic/Immunologic: Negative for environmental allergies.  Neurological:  Negative for dizziness, tremors, seizures, weakness and headaches.  Hematological:  Does not bruise/bleed easily.  Psychiatric/Behavioral:  Negative for hallucinations and suicidal ideas. The patient is not nervous/anxious.     Objective    BP 134/81 (BP Location: Left Arm, Patient Position: Sitting, Cuff Size: Normal)   Pulse 79   Ht 5\' 11"  (1.803 m)   Wt 255 lb 1.6 oz (115.7 kg)   SpO2 96%   BMI 35.58 kg/m    Physical Exam   General Appearance:    Mildly obese male. Alert, cooperative, in no acute distress, appears stated age  Head:    Normocephalic, without obvious abnormality, atraumatic  Eyes:    PERRL, conjunctiva/corneas clear, EOM's intact, fundi    benign, both eyes       Ears:    Normal TM's and external ear canals, both ears  Nose:   Nares normal, septum midline, mucosa normal, no drainage   or sinus tenderness  Throat:   Lips, mucosa, and tongue normal; teeth and gums normal  Neck:   Supple, symmetrical, trachea midline, no adenopathy;       thyroid:  No enlargement/tenderness/nodules; no carotid   bruit or JVD  Back:     Symmetric, no curvature, ROM normal, no CVA tenderness  Lungs:     Clear to auscultation bilaterally, respirations unlabored  Chest wall:    No tenderness or  deformity  Heart:    Normal heart rate. Normal rhythm. No murmurs, rubs, or gallops.  S1 and S2 normal  Abdomen:     Soft, non-tender, bowel sounds active all four quadrants,    no masses, no organomegaly  Genitalia:    deferred  Rectal:    deferred  Extremities:   All extremities are intact. No cyanosis or edema  Pulses:   2+ and symmetric all extremities  Skin:   Skin color, texture, turgor normal, no rashes or lesions  Lymph nodes:   Cervical, supraclavicular, and axillary nodes normal  Neurologic:   CNII-XII intact. Normal strength, sensation and reflexes      throughout  A1c=7.4 UTD on routine labs EKG: NSR  Last depression screening scores    01/06/2023    9:49 AM 09/13/2022    4:00 PM 10/30/2021    1:53 PM  PHQ 2/9 Scores  PHQ - 2 Score 0 0 0  PHQ- 9 Score 1  0   Last fall risk screening    01/06/2023    9:49 AM  Fall Risk   Falls in the past year? 1  Number falls in past yr: 0  Injury with Fall? 1  Risk for fall due to : History of fall(s);No Fall Risks  Follow up Falls evaluation completed   Last Audit-C alcohol use screening    01/06/2023    9:48 AM  Alcohol Use Disorder Test (AUDIT)  1. How often do you have a drink containing alcohol? 1  2. How many drinks containing alcohol do you have on a typical day when you are drinking? 0  3. How often do you have six or more drinks on one occasion? 0  AUDIT-C Score 1   A score of 3 or more in women, and 4 or more in men  indicates increased risk for alcohol abuse, EXCEPT if all of the points are from question 1     Assessment & Plan    Routine Health Maintenance and Physical Exam  Exercise Activities and Dietary recommendations  Goals   None     Immunization History  Administered Date(s) Administered   Hepatitis A, Adult 05/25/2018, 06/01/2019   PFIZER(Purple Top)SARS-COV-2 Vaccination 11/16/2019, 12/07/2019, 09/12/2020   Pneumococcal Polysaccharide-23 06/01/2013   Td 02/14/2012   Tdap 05/27/2007, 05/10/2022    Zoster Recombinant(Shingrix) 07/04/2020, 09/12/2020    Health Maintenance  Topic Date Due   HIV Screening  Never done   COVID-19 Vaccine (4 - 2023-24 season) 05/03/2022   OPHTHALMOLOGY EXAM  05/17/2022   Diabetic kidney evaluation - Urine ACR  02/02/2023   INFLUENZA VACCINE  12/01/2023 (Originally 04/03/2023)   HEMOGLOBIN A1C  07/09/2023   Diabetic kidney evaluation - eGFR measurement  01/06/2024   Colonoscopy  08/18/2025   DTaP/Tdap/Td (4 - Td or Tdap) 05/10/2032   Hepatitis C Screening  Completed   Zoster Vaccines- Shingrix  Completed   HPV VACCINES  Aged Out    Discussed health benefits of physical activity, and encouraged him to engage in regular exercise appropriate for his age and condition.  EKG: NSR   2. Type 2 diabetes mellitus with retinopathy, without long-term current use of insulin, macular edema presence unspecified, unspecified laterality, unspecified retinopathy severity (HCC) Well controlled.  Continue current medications.   Is scheduled for eye exam on 9-26 - Urine Microalbumin w/creat. ratio - EKG 12-Lead  3. Primary hypertension Near goal, Continue current medications.   - EKG 12-Lead  4. Hyperlipidemia, unspecified hyperlipidemia type He is tolerating simvastatin well with no adverse effects.    5. History of adenomatous polyp of colon Colonoscopy due 1-3 years.   6. Influenza vaccination declined by patient   Return in about 4 months (around 08/18/2023). For diabetes       Mila Merry, MD  [facility] [phone] (phone) [phone] (fax)  [facility]

## 2023-04-18 NOTE — Patient Instructions (Signed)
.   Please review the attached list of medications and notify my office if there are any errors.   . Please bring all of your medications to every appointment so we can make sure that our medication list is the same as yours.   

## 2023-04-19 LAB — MICROALBUMIN / CREATININE URINE RATIO
Creatinine, Urine: 68.1 mg/dL
Microalb/Creat Ratio: <4 mg/g{creat} (ref 0–29)
Microalbumin, Urine: <3 ug/mL

## 2023-04-23 ENCOUNTER — Other Ambulatory Visit: Payer: Self-pay | Admitting: Family Medicine

## 2023-05-20 ENCOUNTER — Other Ambulatory Visit: Payer: Self-pay

## 2023-05-20 DIAGNOSIS — Z125 Encounter for screening for malignant neoplasm of prostate: Secondary | ICD-10-CM

## 2023-05-20 NOTE — Progress Notes (Unsigned)
Type of Exam: Face & Arms   Exam comments: Discussed skin care including sunscreen  Presumptive diagnosis: no significant findings Biopsy recommended? No Referred? No

## 2023-05-21 ENCOUNTER — Other Ambulatory Visit: Payer: Self-pay | Admitting: Hematology and Oncology

## 2023-05-21 ENCOUNTER — Other Ambulatory Visit: Payer: Self-pay | Admitting: *Deleted

## 2023-05-21 DIAGNOSIS — Z125 Encounter for screening for malignant neoplasm of prostate: Secondary | ICD-10-CM

## 2023-05-21 DIAGNOSIS — Z1283 Encounter for screening for malignant neoplasm of skin: Secondary | ICD-10-CM

## 2023-05-21 NOTE — Progress Notes (Signed)
Patient: Daniel Alvarado           Date of Birth: [birth year]           MRN: [id number] Visit Date: 05/21/2023 PCP: Malva Limes, MD  Prostate Cancer Screening Date of last physical exam: 04/18/23 Date of last rectal exam:  (Unknown) Have you ever had any of the following?: None Have you ever had or been told you have an allergy to latex products?:  (Unknown) Are you currently taking any natural prostate preparations?:  (Unknown) Are you currently experiencing any urinary symptoms?:  (Unknown)  Prostate Exam Exam not completed. PSA Blood test completed only.  Patient's History Patient Active Problem List   Diagnosis Date Noted   BPH (benign prostatic hyperplasia) 05/10/2022   Retinopathy of right eye 05/31/2020   History of adenomatous polyp of colon 08/19/2018   Hypoxia 02/08/2015   Hypotestosteronism 02/08/2015   Abnormal prostate specific antigen 02/08/2015   Decreased libido 02/08/2015   Morbid obesity (HCC) 12/11/2007   Type 2 diabetes mellitus with ophthalmic complication (HCC) 09/02/2004   Hypertension 09/02/2004   HLD (hyperlipidemia) 09/02/2004   Past Medical History:  Diagnosis Date   Allergy    2 weeks in September hay fever   Decreased libido    Diabetes mellitus without complication (HCC) 2016   Type 2   Heart murmur 1979   Heart operation in 1979   Hypertension 1979   Morbid obesity (HCC)     Family History  Problem Relation Age of Onset   Diabetes Mother    Arthritis Mother     Social History   Occupational History    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

## 2023-05-22 LAB — PSA: Prostate Specific Ag, Serum: 0.5 ng/mL (ref 0.0–4.0)

## 2023-05-23 ENCOUNTER — Telehealth: Payer: Self-pay | Admitting: Family Medicine

## 2023-05-23 DIAGNOSIS — N401 Enlarged prostate with lower urinary tract symptoms: Secondary | ICD-10-CM

## 2023-05-23 MED ORDER — TAMSULOSIN HCL 0.4 MG PO CAPS
0.4000 mg | ORAL_CAPSULE | Freq: Every day | ORAL | 2 refills | Status: AC
Start: 2023-05-23 — End: ?

## 2023-05-23 NOTE — Telephone Encounter (Signed)
[facility] pharmacy faxed refill request for the following medications:   tamsulosin (FLOMAX) 0.4 MG CAPS capsule      Please advise

## 2023-06-05 DIAGNOSIS — H2513 Age-related nuclear cataract, bilateral: Secondary | ICD-10-CM | POA: Diagnosis not present

## 2023-06-05 DIAGNOSIS — E113293 Type 2 diabetes mellitus with mild nonproliferative diabetic retinopathy without macular edema, bilateral: Secondary | ICD-10-CM | POA: Diagnosis not present

## 2023-06-05 DIAGNOSIS — H524 Presbyopia: Secondary | ICD-10-CM | POA: Diagnosis not present

## 2023-06-05 LAB — HM DIABETES EYE EXAM

## 2023-06-10 ENCOUNTER — Other Ambulatory Visit: Payer: Self-pay | Admitting: Family Medicine

## 2023-06-10 DIAGNOSIS — N401 Enlarged prostate with lower urinary tract symptoms: Secondary | ICD-10-CM

## 2023-06-10 MED ORDER — DUTASTERIDE 0.5 MG PO CAPS: 0.5000 mg | ORAL_CAPSULE | Freq: Every day | ORAL | 4 refills | Status: DC

## 2023-06-10 NOTE — Telephone Encounter (Signed)
[facility] is requesting prescription refill dutasteride (AVODART) 0.5 MG capsule  Please advise

## 2023-06-10 NOTE — Telephone Encounter (Signed)
Historical Provider. Please review.

## 2023-06-30 ENCOUNTER — Other Ambulatory Visit: Payer: Self-pay | Admitting: Family Medicine

## 2023-06-30 DIAGNOSIS — I10 Essential (primary) hypertension: Secondary | ICD-10-CM

## 2023-07-19 ENCOUNTER — Other Ambulatory Visit: Payer: Self-pay | Admitting: Family Medicine

## 2023-08-18 ENCOUNTER — Ambulatory Visit: Payer: BC Managed Care – PPO | Admitting: Family Medicine

## 2023-08-18 VITALS — BP 115/65 | HR 85 | Temp 98.0°F | Resp 14 | Ht 71.0 in | Wt 253.6 lb

## 2023-08-18 DIAGNOSIS — E11319 Type 2 diabetes mellitus with unspecified diabetic retinopathy without macular edema: Secondary | ICD-10-CM | POA: Diagnosis not present

## 2023-08-18 DIAGNOSIS — Z7985 Long-term (current) use of injectable non-insulin antidiabetic drugs: Secondary | ICD-10-CM | POA: Diagnosis not present

## 2023-08-18 DIAGNOSIS — I10 Essential (primary) hypertension: Secondary | ICD-10-CM

## 2023-08-18 LAB — POCT GLYCOSYLATED HEMOGLOBIN (HGB A1C)
Est. average glucose Bld gHb Est-mCnc: 166
Hemoglobin A1C: 7.4 % — AB (ref 4.0–5.6)

## 2023-08-18 MED ORDER — FELODIPINE ER 5 MG PO TB24
5.0000 mg | ORAL_TABLET | Freq: Every day | ORAL | 2 refills | Status: DC
Start: 2023-08-18 — End: 2023-12-19

## 2023-08-18 MED ORDER — TIRZEPATIDE 10 MG/0.5ML ~~LOC~~ SOAJ: 10.0000 mg | SUBCUTANEOUS | 2 refills | Status: DC

## 2023-08-18 NOTE — Progress Notes (Signed)
Established patient visit   Patient: Daniel Alvarado   DOB: [birth year]   62 y.o. Male  MRN: [id number] Visit Date: 08/18/2023  Today's healthcare provider: Mila Merry, MD   Chief Complaint  Patient presents with   Follow-up    Patient has noticed hat if he bends down and stands right back up he loses balances a few seconds and becomes dizzy. Can mounjaro dose go up from 7.5 to 10 ?   Subjective    HPI Follow up htn and diabetes. Feels well, tolerating meds but feels a little light headed at times as above. No GI effects from [facility], has been on 7.5mg  since April. Is adhering to low sugar diet. .  Lab Results  Component Value Date   NA 140 01/06/2023   CL 99 01/06/2023   K 3.8 01/06/2023   CO2 24 01/06/2023   BUN 21 01/06/2023   CREATININE 1.00 01/06/2023   EGFR 86 01/06/2023   CALCIUM 9.9 01/06/2023   PHOS 3.2 01/05/2018   ALBUMIN 3.9 01/06/2023   GLUCOSE 135 (H) 01/06/2023   Lab Results  Component Value Date   HGBA1C 7.4 (A) 08/18/2023   HGBA1C 7.4 (A) 04/18/2023   HGBA1C 7.5 (A) 01/06/2023   Wt Readings from Last 3 Encounters:  08/18/23 253 lb 9.6 oz (115 kg)  04/18/23 255 lb 1.6 oz (115.7 kg)  01/06/23 251 lb 9.6 oz (114.1 kg)     Medications: Outpatient Medications Prior to Visit  Medication Sig   aspirin 81 MG tablet    dutasteride (AVODART) 0.5 MG capsule Take 1 capsule (0.5 mg total) by mouth daily.   JARDIANCE 25 MG TABS tablet TAKE 1 TABLET BY MOUTH DAILY   KLOR-CON M20 20 MEQ tablet TAKE ONE TABLET BY MOUTH EVERY MORNING   lisinopril-hydrochlorothiazide (ZESTORETIC) 20-25 MG tablet TAKE 2 TABLETS BY MOUTH DAILY   metFORMIN (GLUCOPHAGE-XR) 500 MG 24 hr tablet TAKE TWO TABLETS BY MOUTH TWICE A DAY   metoprolol succinate (TOPROL-XL) 100 MG 24 hr tablet TAKE 1 TABLET BY MOUTH DAILY   MULTIPLE VITAMIN PO    OMEGA-3 FATTY ACIDS PO Take 1,200 mcg by mouth.    simvastatin (ZOCOR) 40 MG tablet Take 1 tablet (40 mg total) by mouth at bedtime.    tadalafil (CIALIS) 5 MG tablet Take 1 tablet (5 mg total) by mouth daily. For BPH N40.0   tamsulosin (FLOMAX) 0.4 MG CAPS capsule Take 1 capsule (0.4 mg total) by mouth daily.   [DISCONTINUED] felodipine (PLENDIL) 10 MG 24 hr tablet TAKE ONE TABLET BY MOUTH DAILY FOR BLOOD PRESSURE   [DISCONTINUED] tirzepatide (MOUNJARO) 7.5 MG/0.5ML Pen Inject 7.5 mg into the skin once a week.   No facility-administered medications prior to visit.    Review of Systems  Constitutional:  Negative for appetite change, chills and fever.  Respiratory:  Negative for chest tightness, shortness of breath and wheezing.   Cardiovascular:  Negative for chest pain and palpitations.  Gastrointestinal:  Negative for abdominal pain, nausea and vomiting.       Objective    BP 115/65 (BP Location: Left Arm, Patient Position: Sitting, Cuff Size: Large)   Pulse 85   Temp 98 F (36.7 C)   Resp 14   Ht 5\' 11"  (1.803 m)   Wt 253 lb 9.6 oz (115 kg)   SpO2 94%   BMI 35.37 kg/m    Physical Exam   General: Appearance:    Mildly obese male in no acute distress  Eyes:    PERRL, conjunctiva/corneas clear, EOM's intact       Lungs:     Clear to auscultation bilaterally, respirations unlabored  Heart:    Normal heart rate. Normal rhythm. No murmurs, rubs, or gallops.    MS:   All extremities are intact.    Neurologic:   Awake, alert, oriented x 3. No apparent focal neurological defect.        Results for orders placed or performed in visit on 08/18/23  POCT glycosylated hemoglobin (Hb A1C)  Result Value Ref Range   Hemoglobin A1C 7.4 (A) 4.0 - 5.6 %   Est. average glucose Bld gHb Est-mCnc 166     Assessment & Plan     1. Type 2 diabetes mellitus with retinopathy, without long-term current use of insulin, macular edema presence unspecified, unspecified laterality, unspecified retinopathy severity (HCC) (Primary)  Tolerating current dm meds with adverse effect Increase Mounjaro to 10mg    2. Morbid obesity  (HCC) Expect some weight loss with higher dose of Mounjaro  3. Essential hypertension Very well controlled but having some orthostatic sx. Reduce from 10mg  to felodipine (PLENDIL) 5 MG 24 hr tablet; Take 1 tablet (5 mg total) by mouth daily.  Dispense: 90 tablet; Refill: 2  Return in about 4 months (around 12/17/2023).        Mila Merry, MD  [facility] [phone] (phone) [phone] (fax)  [facility]

## 2023-08-18 NOTE — Patient Instructions (Signed)
Please review the attached list of medications and notify my office if there are any errors.   We're changing the dose of felodipine (Plendil) to 5mg  once a day.   We're changing the dose of Mounjaro to 10mg  once a week.

## 2023-08-19 DIAGNOSIS — E1142 Type 2 diabetes mellitus with diabetic polyneuropathy: Secondary | ICD-10-CM | POA: Diagnosis not present

## 2023-09-01 ENCOUNTER — Telehealth: Payer: Self-pay | Admitting: Family Medicine

## 2023-09-01 NOTE — Telephone Encounter (Signed)
Received a fax from covermymeds for Mounjaro 2.5mg /0.29ml  Key:  [id number]

## 2023-09-05 ENCOUNTER — Other Ambulatory Visit: Payer: Self-pay | Admitting: Family Medicine

## 2023-09-05 DIAGNOSIS — I10 Essential (primary) hypertension: Secondary | ICD-10-CM

## 2023-09-08 ENCOUNTER — Telehealth: Payer: Self-pay | Admitting: Family Medicine

## 2023-09-08 NOTE — Telephone Encounter (Signed)
 Covermymeds is requesting prior authorization Key: [id number] Mounjaro 2.5 mg/0.37ml auto injectors

## 2023-09-09 NOTE — Telephone Encounter (Signed)
 Duplicate encounter. Will continue documentation in original encounter.

## 2023-09-16 NOTE — Telephone Encounter (Signed)
Information regarding your request Prior Authorization Not Required 

## 2023-09-19 ENCOUNTER — Other Ambulatory Visit: Payer: Self-pay | Admitting: Family Medicine

## 2023-09-19 DIAGNOSIS — N401 Enlarged prostate with lower urinary tract symptoms: Secondary | ICD-10-CM

## 2023-10-17 ENCOUNTER — Other Ambulatory Visit: Payer: Self-pay | Admitting: Family Medicine

## 2023-12-19 ENCOUNTER — Encounter: Payer: Self-pay | Admitting: Family Medicine

## 2023-12-19 ENCOUNTER — Ambulatory Visit: Payer: Self-pay | Admitting: Family Medicine

## 2023-12-19 VITALS — BP 108/72 | HR 87 | Resp 16 | Ht 71.0 in | Wt 240.8 lb

## 2023-12-19 DIAGNOSIS — Z6833 Body mass index (BMI) 33.0-33.9, adult: Secondary | ICD-10-CM

## 2023-12-19 DIAGNOSIS — I10 Essential (primary) hypertension: Secondary | ICD-10-CM | POA: Diagnosis not present

## 2023-12-19 DIAGNOSIS — Z7984 Long term (current) use of oral hypoglycemic drugs: Secondary | ICD-10-CM

## 2023-12-19 DIAGNOSIS — E11319 Type 2 diabetes mellitus with unspecified diabetic retinopathy without macular edema: Secondary | ICD-10-CM | POA: Diagnosis not present

## 2023-12-19 LAB — POCT GLYCOSYLATED HEMOGLOBIN (HGB A1C)
Est. average glucose Bld gHb Est-mCnc: 148
Hemoglobin A1C: 6.8 % — AB (ref 4.0–5.6)

## 2023-12-19 MED ORDER — FELODIPINE ER 2.5 MG PO TB24: 2.5000 mg | ORAL_TABLET | Freq: Every day | ORAL | 1 refills | Status: DC

## 2023-12-19 NOTE — Progress Notes (Signed)
 Established patient visit   Patient: Daniel Alvarado   DOB: 1961/05/07   63 y.o. Male  MRN: [id number] Visit Date: 12/19/2023  Today's healthcare provider: Jeralene Mom, MD   Chief Complaint  Patient presents with   Diabetes   Medical Management of Chronic Issues   Subjective    Diabetes Pertinent negatives for diabetes include no chest pain.    Follow up htn, obesity and diabetes since increasing mounjaro  to 10mg  in December. Is tolerating well except having more heartburn if he eats spicy foods. Takes OTC antacid a few times each week which works well. Has also reduced felodipine  from 10mg  to 5mg  since last visit in December.   Lab Results  Component Value Date   HGBA1C 6.8 (A) 12/19/2023   HGBA1C 7.4 (A) 08/18/2023   HGBA1C 7.4 (A) 04/18/2023   Wt Readings from Last 5 Encounters:  12/19/23 240 lb 12.8 oz (109.2 kg)  08/18/23 253 lb 9.6 oz (115 kg)  04/18/23 255 lb 1.6 oz (115.7 kg)  01/06/23 251 lb 9.6 oz (114.1 kg)  09/13/22 261 lb 3.2 oz (118.5 kg)   BP Readings from Last 3 Encounters:  12/19/23 108/72  08/18/23 115/65  04/18/23 134/81     Medications: Outpatient Medications Prior to Visit  Medication Sig   aspirin 81 MG tablet    dutasteride  (AVODART ) 0.5 MG capsule Take 1 capsule (0.5 mg total) by mouth daily.   JARDIANCE  25 MG TABS tablet TAKE 1 TABLET BY MOUTH DAILY   KLOR-CON  M20 20 MEQ tablet TAKE ONE TABLET BY MOUTH EVERY MORNING   lisinopril -hydrochlorothiazide  (ZESTORETIC ) 20-25 MG tablet TAKE 2 TABLETS BY MOUTH DAILY   metFORMIN  (GLUCOPHAGE -XR) 500 MG 24 hr tablet TAKE TWO TABLETS BY MOUTH TWICE A DAY   metoprolol  succinate (TOPROL -XL) 100 MG 24 hr tablet TAKE 1 TABLET BY MOUTH DAILY   MULTIPLE VITAMIN PO    OMEGA-3 FATTY ACIDS PO Take 1,200 mcg by mouth.    simvastatin  (ZOCOR ) 40 MG tablet Take 1 tablet (40 mg total) by mouth at bedtime.   tadalafil  (CIALIS ) 5 MG tablet TAKE 1 TABLET BY MOUTH DAILY   tamsulosin  (FLOMAX ) 0.4 MG CAPS  capsule Take 1 capsule (0.4 mg total) by mouth daily.   tirzepatide  (MOUNJARO ) 10 MG/0.5ML Pen Inject 10 mg into the skin once a week.   felodipine  (PLENDIL ) 5 MG 24 hr tablet Take 1 tablet (5 mg total) by mouth daily.   No facility-administered medications prior to visit.    Review of Systems  Constitutional:  Negative for appetite change, chills and fever.  Respiratory:  Negative for chest tightness, shortness of breath and wheezing.   Cardiovascular:  Negative for chest pain and palpitations.  Gastrointestinal:  Negative for abdominal pain, nausea and vomiting.   {Insert previous labs (optional):23779} {See past labs  Heme  Chem  Endocrine  Serology  Results Review (optional):1}   Objective    BP 108/72 (BP Location: Left Arm, Patient Position: Sitting, Cuff Size: Normal)   Pulse 87   Resp 16   Ht 5\' 11"  (1.803 m)   Wt 240 lb 12.8 oz (109.2 kg)   SpO2 99%   BMI 33.58 kg/m  {Insert last BP/Wt (optional):23777}{See vitals history (optional):1}  Physical Exam   General appearance: Mildly obese male, cooperative and in no acute distress Head: Normocephalic, without obvious abnormality, atraumatic Respiratory: Respirations even and unlabored, normal respiratory rate Extremities: All extremities are intact.  Skin: Skin color, texture, turgor normal. No rashes  seen  Psych: Appropriate mood and affect. Neurologic: Mental status: Alert, oriented to person, place, and time, thought content appropriate.   Results for orders placed or performed in visit on 12/19/23  POCT glycosylated hemoglobin (Hb A1C)  Result Value Ref Range   Hemoglobin A1C 6.8 (A) 4.0 - 5.6 %   Est. average glucose Bld gHb Est-mCnc 148     Assessment & Plan     1. Type 2 diabetes mellitus with retinopathy, without long-term current use of insulin, macular edema presence unspecified, unspecified laterality, unspecified retinopathy severity (HCC) (Primary) Well controlled.  Continue current medications.     2. Primary hypertension BP remains very well controlled since lowering felodipine  to 5mg . Will lower to 2.5 MG 24 hr tablet; Take 1 tablet (2.5 mg total) by mouth daily.  Dispense: 90 tablet; Refill: 1  3. Morbid obesity (HCC) Working on trying to exercising more, doing well with diet, and steady weight loss seen since starting Mounjaro .    Return in about 4 months (around 04/19/2024) for Yearly Physical.         Jeralene Mom, MD  [facility] [phone] (phone) [phone] (fax)  [facility]

## 2023-12-19 NOTE — Patient Instructions (Signed)
 Marland Kitchen  Please review the attached list of medications and notify my office if there are any errors.   . Please bring all of your medications to every appointment so we can make sure that our medication list is the same as yours.

## 2024-01-15 ENCOUNTER — Other Ambulatory Visit: Payer: Self-pay | Admitting: Family Medicine

## 2024-02-13 ENCOUNTER — Other Ambulatory Visit: Payer: Self-pay | Admitting: Family Medicine

## 2024-02-13 DIAGNOSIS — N401 Enlarged prostate with lower urinary tract symptoms: Secondary | ICD-10-CM

## 2024-03-02 DIAGNOSIS — E119 Type 2 diabetes mellitus without complications: Secondary | ICD-10-CM | POA: Diagnosis not present

## 2024-03-07 ENCOUNTER — Other Ambulatory Visit: Payer: Self-pay | Admitting: Family Medicine

## 2024-03-07 DIAGNOSIS — E11649 Type 2 diabetes mellitus with hypoglycemia without coma: Secondary | ICD-10-CM

## 2024-03-09 DIAGNOSIS — E119 Type 2 diabetes mellitus without complications: Secondary | ICD-10-CM | POA: Diagnosis not present

## 2024-04-02 DIAGNOSIS — E119 Type 2 diabetes mellitus without complications: Secondary | ICD-10-CM | POA: Diagnosis not present

## 2024-04-19 ENCOUNTER — Ambulatory Visit (INDEPENDENT_AMBULATORY_CARE_PROVIDER_SITE_OTHER): Admitting: Family Medicine

## 2024-04-19 VITALS — BP 101/68 | HR 89 | Resp 16 | Wt 232.7 lb

## 2024-04-19 DIAGNOSIS — E11319 Type 2 diabetes mellitus with unspecified diabetic retinopathy without macular edema: Secondary | ICD-10-CM

## 2024-04-19 DIAGNOSIS — Z7984 Long term (current) use of oral hypoglycemic drugs: Secondary | ICD-10-CM

## 2024-04-19 DIAGNOSIS — I10 Essential (primary) hypertension: Secondary | ICD-10-CM | POA: Diagnosis not present

## 2024-04-19 DIAGNOSIS — Z23 Encounter for immunization: Secondary | ICD-10-CM

## 2024-04-19 DIAGNOSIS — E785 Hyperlipidemia, unspecified: Secondary | ICD-10-CM

## 2024-04-19 DIAGNOSIS — Z0000 Encounter for general adult medical examination without abnormal findings: Secondary | ICD-10-CM

## 2024-04-19 DIAGNOSIS — Z7985 Long-term (current) use of injectable non-insulin antidiabetic drugs: Secondary | ICD-10-CM

## 2024-04-19 NOTE — Progress Notes (Signed)
 Complete physical exam   Patient: Daniel Alvarado   DOB: 06/19/1961   62 y.o. Male  MRN: [id number] Visit Date: 04/19/2024  Today's healthcare provider: Nancyann Perry, MD   Chief Complaint  Patient presents with   Annual Exam    Pattern of eating: eats good 3 meals a day  Pattern of sleep: 6 hours, 1 day a week only wakes frequently  Are you exercising: yes  What type of exercising: moderate, walks  How long: 20-30 mins  How frequent: 3 days a week   Vaccines: Pneumocococcal vaccine    Subjective    Discussed the use of AI scribe software for clinical note transcription with the patient, who gave verbal consent to proceed.  History of Present Illness   Daniel Alvarado is a 63 year old male who presents for an annual physical exam.  He has type 2 diabetes mellitus, managed with Mounjaro  10 mg, Jardiance , and metformin . His blood sugar levels have been stable. Previously, he required weekly laxatives due to infrequent bowel movements, but he now reports regular bowel movements without the need for laxatives.  He experienced a past episode of near-fainting at work in a hot environment, which he attributes to fatigue and heat. He monitors his blood pressure at home with readings similar to those in the clinic. He is currently taking felodipine , which was reduced from 5 mg to 2.5 mg. He also continues on metoprolol  and lisinopril -hctz  He has a history of tobacco use, specifically dipping for forty years, leading to dental concerns such as gum wear and pockets. A dental surgeon evaluated him, finding no cancerous lesions but noted the presence of pockets.  No hearing issues, but he occasionally needs to ask for repetition. He has an upcoming eye doctor appointment and recently saw a foot doctor, with no significant findings reported.     Wt Readings from Last 8 Encounters:  04/19/24 232 lb 11.2 oz (105.6 kg)  12/19/23 240 lb 12.8 oz (109.2 kg)  08/18/23 253 lb 9.6 oz  (115 kg)  04/18/23 255 lb 1.6 oz (115.7 kg)  01/06/23 251 lb 9.6 oz (114.1 kg)  09/13/22 261 lb 3.2 oz (118.5 kg)  05/10/22 253 lb (114.8 kg)  02/01/22 259 lb (117.5 kg)       Past Medical History:  Diagnosis Date   Allergy    2 weeks in September hay fever   Decreased libido    Diabetes mellitus without complication (HCC) 2016   Type 2   Heart murmur 1979   Heart operation in 1979   Hypertension 1979   Morbid obesity [facility])    Past Surgical History:  Procedure Laterality Date   AORTA SURGERY  09/02/1976   CARDIAC VALVE REPLACEMENT  1979   1979 operation   COLONOSCOPY WITH PROPOFOL  N/A 08/18/2018   Procedure: COLONOSCOPY WITH PROPOFOL ;  Surgeon: Therisa Bi, MD;  Location: [facility] ENDOSCOPY;  Service: Gastroenterology;  Laterality: N/A;   PROSTATE SURGERY  2018   Dr. 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 Continued on at same employer part time   Social Drivers of Health   Financial Resource Strain: Low Risk  (04/19/2024)   Overall Financial Resource Strain (CARDIA)    Difficulty of Paying Living Expenses: Not very hard  Food Insecurity: No Food Insecurity (04/19/2024)   Hunger Vital Sign    Worried About Running Out of  Food in the Last Year: Never true    Ran Out of Food in the Last Year: Never true  Transportation Needs: No Transportation Needs (04/19/2024)   PRAPARE - Administrator, Civil Service (Medical): No    Lack of Transportation (Non-Medical): No  Physical Activity: Insufficiently Active (04/19/2024)   Exercise Vital Sign    Days of Exercise per Week: 3 days    Minutes of Exercise per Session: 20 min  Stress: No Stress Concern Present (04/19/2024)   [employer] of Occupational Health - Occupational Stress Questionnaire    Feeling of Stress: Not at all  Social Connections: Socially Isolated (04/19/2024)   Social Connection and Isolation Panel    Frequency of Communication with Friends and Family: More than three times a week    Frequency of Social Gatherings with Friends and Family: Twice a week    Attends Religious Services: Patient declined    Active Member of Clubs or Organizations: No    Attends Engineer, structural: Not on file    Marital Status: Divorced  Intimate Partner Violence: Not At Risk (04/19/2024)   Humiliation, Afraid, Rape, and Kick questionnaire    Fear of Current or Ex-Partner: No    Emotionally Abused: No    Physically Abused: No    Sexually Abused: No   Family Status  Relation Name Status   Mother Rodrick Payson Alive       broken neck for 20 yrs   Father  Deceased at age 21       multiple sclerosis  No partnership data on file   Family History  Problem Relation Age of Onset   Diabetes Mother    Arthritis Mother    Allergies  Allergen Reactions   Aspirin Other (See Comments)    Hyper    Patient Care Team: Gasper Nancyann BRAVO, MD as PCP - General (Family Medicine) Lucio Franky PARAS, OD (Optometry)   Medications: Outpatient Medications Prior to Visit  Medication Sig Note   aspirin 81 MG tablet     dutasteride  (AVODART ) 0.5 MG capsule Take 1 capsule (0.5 mg total) by mouth daily.    empagliflozin  (JARDIANCE ) 25 MG TABS tablet TAKE 1  TABLET BY MOUTH DAILY    KLOR-CON  M20 20 MEQ tablet TAKE ONE TABLET BY MOUTH EVERY MORNING    lisinopril -hydrochlorothiazide  (ZESTORETIC ) 20-25 MG tablet TAKE 2 TABLETS BY MOUTH DAILY    metFORMIN  (GLUCOPHAGE -XR) 500 MG 24 hr tablet TAKE 2 TABLETS BY MOUTH TWICE A DAY    metoprolol  succinate (TOPROL -XL) 100 MG 24 hr tablet TAKE 1 TABLET BY MOUTH DAILY    MULTIPLE VITAMIN PO  OMEGA-3 FATTY ACIDS PO Take 1,200 mcg by mouth.     simvastatin  (ZOCOR ) 40 MG tablet Take 1 tablet (40 mg total) by mouth at bedtime.    tadalafil  (CIALIS ) 5 MG tablet TAKE 1 TABLET BY MOUTH DAILY    tamsulosin  (FLOMAX ) 0.4 MG CAPS capsule TAKE 1 CAPSULE BY MOUTH DAILY    tirzepatide  (MOUNJARO ) 10 MG/0.5ML Pen Inject 10 mg into the skin once a week.    [DISCONTINUED] felodipine  (PLENDIL ) 2.5 MG 24 hr tablet Take 1 tablet (2.5 mg total) by mouth daily. 04/19/2024: BP is running low   No facility-administered medications prior to visit.    Review of Systems  Constitutional:  Negative for appetite change, chills and fever.  Respiratory:  Negative for chest tightness, shortness of breath and wheezing.   Cardiovascular:  Negative for chest pain and palpitations.  Gastrointestinal:  Negative for abdominal pain, nausea and vomiting.      Objective    BP 101/68 (BP Location: Left Arm, Patient Position: Sitting, Cuff Size: Large)   Pulse 89   Resp 16   Wt 232 lb 11.2 oz (105.6 kg)   SpO2 95%   BMI 32.46 kg/m    Physical Exam  General Appearance:    Mildly obese male. Alert, cooperative, in no acute distress, appears stated age  Head:    Normocephalic, without obvious abnormality, atraumatic  Eyes:    PERRL, conjunctiva/corneas clear, EOM's intact, fundi    benign, both eyes       Ears:    Normal TM's and external ear canals, both ears  Nose:   Nares normal, septum midline, mucosa normal, no drainage   or sinus tenderness  Throat:   Lips, mucosa, and tongue normal; teeth and gums normal  Neck:   Supple,  symmetrical, trachea midline, no adenopathy;       thyroid:  No enlargement/tenderness/nodules; no carotid   bruit or JVD  Back:     Symmetric, no curvature, ROM normal, no CVA tenderness  Lungs:     Clear to auscultation bilaterally, respirations unlabored  Chest wall:    No tenderness or deformity  Heart:    Normal heart rate. Normal rhythm. No murmurs, rubs, or gallops.  S1 and S2 normal  Abdomen:     Soft, non-tender, bowel sounds active all four quadrants,    no masses, no organomegaly  Genitalia:    deferred  Rectal:    deferred  Extremities:   All extremities are intact. No cyanosis or edema  Pulses:   2+ and symmetric all extremities  Skin:   Skin color, texture, turgor normal, no rashes or lesions  Lymph nodes:   Cervical, supraclavicular, and axillary nodes normal  Neurologic:   CNII-XII intact. Normal strength, sensation and reflexes      throughout       Last depression screening scores    04/19/2024    9:38 AM 12/19/2023    9:32 AM 08/18/2023    9:34 AM  PHQ 2/9 Scores  PHQ - 2 Score 0 0 0  PHQ- 9 Score 1  0   Last fall risk screening    12/19/2023    9:32 AM  Fall Risk   Falls in the past year? 0  Number falls in past yr: 0  Injury with Fall? 0  Risk for fall due to : No Fall Risks   Last Audit-C alcohol use screening    04/19/2024    8:46 AM  Alcohol Use Disorder Test (AUDIT)  1. How often do you have a drink containing alcohol? 1  2. How many drinks containing alcohol do you have on a typical day when you are drinking? 0  3. How often do you have six or more drinks on one occasion? 0  AUDIT-C Score 1      Patient-reported   A score of 3 or more in women, and 4 or more in men indicates increased risk for alcohol abuse, EXCEPT if all of the points are from question 1     Assessment & Plan    Routine Health Maintenance and Physical Exam  Exercise Activities and Dietary recommendations  Goals   None     Immunization History  Administered  Date(s) Administered   Hepatitis A, Adult 05/25/2018, 06/01/2019   PFIZER(Purple Top)SARS-COV-2 Vaccination 11/16/2019, 12/07/2019, 09/12/2020   PNEUMOCOCCAL CONJUGATE-20 04/19/2024   Pfizer(Comirnaty)Fall Seasonal Vaccine 12 years and older 05/25/2023   Pneumococcal Polysaccharide-23 06/01/2013   Td 05/27/2007, 02/14/2012   Tdap 05/27/2007, 02/14/2012, 05/10/2022   Zoster Recombinant(Shingrix) 07/04/2020, 09/12/2020    Health Maintenance  Topic Date Due   HIV Screening  Never done   Diabetic kidney evaluation - eGFR measurement  01/06/2024   Diabetic kidney evaluation - Urine ACR  04/17/2024   INFLUENZA VACCINE  04/02/2024   OPHTHALMOLOGY EXAM  06/04/2024   HEMOGLOBIN A1C  06/19/2024   Colonoscopy  08/18/2025   DTaP/Tdap/Td (6 - Td or Tdap) 05/10/2032   Pneumococcal Vaccine: 50+ Years  Completed   COVID-19 Vaccine  Completed   Hepatitis C Screening  Completed   Zoster Vaccines- Shingrix  Completed   Hepatitis B Vaccines 19-59 Average Risk  Aged Out   HPV VACCINES  Aged Out   Meningococcal B Vaccine  Aged Out   Pneumococcal Vaccine  Discontinued    Discussed health benefits of physical activity, and encouraged him to engage in regular exercise appropriate for his age and condition.     Adult Wellness Visit Routine visit with no acute concerns. Blood pressure lower than desired without hypotension symptoms. - Administer pneumococcal vaccine. - Order blood work. - Discontinue felodipine . - Monitor blood pressure at home and report if systolic pressure exceeds 130 mmHg.  Hypertension Hypertension well-controlled with felodipine . Blood pressure readings lower than desired range. - Discontinue felodipine . - Monitor blood pressure at home and report if systolic pressure exceeds 130 mmHg.  Type 2 diabetes mellitus Managed with Mounjaro , Jardiance , and metformin . - Continue Mounjaro , Jardiance , and metformin . - Order blood work to assess diabetes control.   Nicotine  dependence, smokeless tobacco Nicotine dependence due to smokeless tobacco use. Reports gum wear and dental concerns. - Encourage cessation of smokeless tobacco use.     Return in about 6 months (around 10/20/2024) for Diabetes, Hypertension.        Nancyann Perry, MD  [facility] [phone] (phone) [phone] (fax)  [facility]

## 2024-04-19 NOTE — Patient Instructions (Signed)
 Please review the attached list of medications and notify my office if there are any errors.   You can stop taking felodipine  since your blood pressure is so low.

## 2024-04-20 LAB — LIPID PANEL

## 2024-04-21 ENCOUNTER — Ambulatory Visit: Payer: Self-pay | Admitting: Family Medicine

## 2024-04-21 LAB — MICROALBUMIN / CREATININE URINE RATIO

## 2024-04-22 LAB — COMPREHENSIVE METABOLIC PANEL WITH GFR
ALT: 15 IU/L (ref 0–44)
AST: 16 IU/L (ref 0–40)
Albumin: 4 g/dL (ref 3.9–4.9)
Alkaline Phosphatase: 70 IU/L (ref 44–121)
BUN/Creatinine Ratio: 23 (ref 10–24)
BUN: 21 mg/dL (ref 8–27)
Bilirubin Total: 0.3 mg/dL (ref 0.0–1.2)
CO2: 24 mmol/L (ref 20–29)
Calcium: 9.8 mg/dL (ref 8.6–10.2)
Chloride: 101 mmol/L (ref 96–106)
Creatinine, Ser: 0.93 mg/dL (ref 0.76–1.27)
Globulin, Total: 2.2 g/dL (ref 1.5–4.5)
Glucose: 139 mg/dL — AB (ref 70–99)
Potassium: 3.7 mmol/L (ref 3.5–5.2)
Sodium: 141 mmol/L (ref 134–144)
Total Protein: 6.2 g/dL (ref 6.0–8.5)
eGFR: 93 mL/min/1.73 (ref >59)

## 2024-04-22 LAB — CBC
Hematocrit: 47.3 % (ref 37.5–51.0)
Hemoglobin: 14.9 g/dL (ref 13.0–17.7)
MCH: 28.8 pg (ref 26.6–33.0)
MCHC: 31.5 g/dL (ref 31.5–35.7)
MCV: 91 fL (ref 79–97)
Platelets: 236 x10E3/uL (ref 150–450)
RBC: 5.18 x10E6/uL (ref 4.14–5.80)
RDW: 12.5 % (ref 11.6–15.4)
WBC: 10 x10E3/uL (ref 3.4–10.8)

## 2024-04-22 LAB — HEMOGLOBIN A1C
Est. average glucose Bld gHb Est-mCnc: 151 mg/dL
Hgb A1c MFr Bld: 6.9 — AB (ref 4.8–5.6)

## 2024-04-22 LAB — LIPID PANEL
Cholesterol, Total: 146 mg/dL (ref 100–199)
HDL: 38 mg/dL — AB (ref >39)
LDL CALC COMMENT:: 3.8 ratio (ref 0.0–5.0)
LDL Chol Calc (NIH): 86 mg/dL (ref 0–99)
Triglycerides: 122 mg/dL (ref 0–149)
VLDL Cholesterol Cal: 22 mg/dL (ref 5–40)

## 2024-04-22 LAB — MICROALBUMIN / CREATININE URINE RATIO

## 2024-04-22 LAB — HIV ANTIBODY (ROUTINE TESTING W REFLEX): HIV Screen 4th Generation wRfx: NONREACTIVE

## 2024-04-23 DIAGNOSIS — H2513 Age-related nuclear cataract, bilateral: Secondary | ICD-10-CM | POA: Diagnosis not present

## 2024-04-23 DIAGNOSIS — H524 Presbyopia: Secondary | ICD-10-CM | POA: Diagnosis not present

## 2024-04-23 DIAGNOSIS — E113293 Type 2 diabetes mellitus with mild nonproliferative diabetic retinopathy without macular edema, bilateral: Secondary | ICD-10-CM | POA: Diagnosis not present

## 2024-04-28 LAB — HM DIABETES EYE EXAM

## 2024-05-03 DIAGNOSIS — E119 Type 2 diabetes mellitus without complications: Secondary | ICD-10-CM | POA: Diagnosis not present

## 2024-05-06 ENCOUNTER — Other Ambulatory Visit: Payer: Self-pay | Admitting: Family Medicine

## 2024-05-31 DIAGNOSIS — D229 Melanocytic nevi, unspecified: Secondary | ICD-10-CM | POA: Diagnosis not present

## 2024-05-31 DIAGNOSIS — L820 Inflamed seborrheic keratosis: Secondary | ICD-10-CM | POA: Diagnosis not present

## 2024-05-31 DIAGNOSIS — L814 Other melanin hyperpigmentation: Secondary | ICD-10-CM | POA: Diagnosis not present

## 2024-05-31 DIAGNOSIS — L821 Other seborrheic keratosis: Secondary | ICD-10-CM | POA: Diagnosis not present

## 2024-06-02 DIAGNOSIS — E119 Type 2 diabetes mellitus without complications: Secondary | ICD-10-CM | POA: Diagnosis not present

## 2024-06-15 ENCOUNTER — Other Ambulatory Visit: Payer: Self-pay | Admitting: Family Medicine

## 2024-06-15 DIAGNOSIS — I10 Essential (primary) hypertension: Secondary | ICD-10-CM

## 2024-07-03 ENCOUNTER — Other Ambulatory Visit: Payer: Self-pay | Admitting: Family Medicine

## 2024-07-03 DIAGNOSIS — E119 Type 2 diabetes mellitus without complications: Secondary | ICD-10-CM | POA: Diagnosis not present

## 2024-07-03 DIAGNOSIS — E785 Hyperlipidemia, unspecified: Secondary | ICD-10-CM

## 2024-07-03 DIAGNOSIS — I10 Essential (primary) hypertension: Secondary | ICD-10-CM

## 2024-08-22 ENCOUNTER — Other Ambulatory Visit: Payer: Self-pay | Admitting: Family Medicine

## 2024-08-22 DIAGNOSIS — N401 Enlarged prostate with lower urinary tract symptoms: Secondary | ICD-10-CM

## 2024-09-13 ENCOUNTER — Other Ambulatory Visit: Payer: Self-pay | Admitting: Family Medicine

## 2024-09-13 DIAGNOSIS — N401 Enlarged prostate with lower urinary tract symptoms: Secondary | ICD-10-CM

## 2024-10-22 ENCOUNTER — Ambulatory Visit: Admitting: Family Medicine

## 2025-04-20 ENCOUNTER — Encounter: Admitting: Family Medicine
